# Patient Record
Sex: Male | Born: 1970 | Race: Black or African American | Hispanic: No | Marital: Married | Smoking: Current every day smoker
Health system: Southern US, Community
[De-identification: ages and names within clinical notes are randomized; demographics above are authoritative.]

## PROBLEM LIST (undated history)

## (undated) DIAGNOSIS — Z720 Tobacco use: Secondary | ICD-10-CM

## (undated) DIAGNOSIS — F1010 Alcohol abuse, uncomplicated: Secondary | ICD-10-CM

## (undated) DIAGNOSIS — I10 Essential (primary) hypertension: Secondary | ICD-10-CM

---

## 2007-02-28 ENCOUNTER — Other Ambulatory Visit: Payer: Self-pay

## 2007-02-28 ENCOUNTER — Emergency Department: Payer: Self-pay | Admitting: Emergency Medicine

## 2008-03-08 ENCOUNTER — Emergency Department: Payer: Self-pay | Admitting: Emergency Medicine

## 2008-10-25 ENCOUNTER — Emergency Department: Payer: Self-pay | Admitting: Emergency Medicine

## 2009-07-19 ENCOUNTER — Emergency Department: Payer: Self-pay | Admitting: Emergency Medicine

## 2012-11-01 ENCOUNTER — Emergency Department: Payer: Self-pay | Admitting: Emergency Medicine

## 2013-04-07 ENCOUNTER — Emergency Department: Payer: Self-pay | Admitting: Emergency Medicine

## 2013-10-25 ENCOUNTER — Emergency Department: Payer: Self-pay | Admitting: Internal Medicine

## 2013-10-25 LAB — CBC WITH DIFFERENTIAL/PLATELET
BASOS ABS: 0.1 10*3/uL (ref 0.0–0.1)
BASOS PCT: 0.6 %
EOS ABS: 0 10*3/uL (ref 0.0–0.7)
Eosinophil %: 0.6 %
HCT: 40.9 % (ref 40.0–52.0)
HGB: 13.7 g/dL (ref 13.0–18.0)
Lymphocyte #: 1.9 10*3/uL (ref 1.0–3.6)
Lymphocyte %: 22.5 %
MCH: 30.1 pg (ref 26.0–34.0)
MCHC: 33.5 g/dL (ref 32.0–36.0)
MCV: 90 fL (ref 80–100)
Monocyte #: 0.9 x10 3/mm (ref 0.2–1.0)
Monocyte %: 10.1 %
Neutrophil #: 5.6 10*3/uL (ref 1.4–6.5)
Neutrophil %: 66.2 %
Platelet: 224 10*3/uL (ref 150–440)
RBC: 4.56 10*6/uL (ref 4.40–5.90)
RDW: 13.3 % (ref 11.5–14.5)
WBC: 8.5 10*3/uL (ref 3.8–10.6)

## 2013-10-25 LAB — COMPREHENSIVE METABOLIC PANEL
ANION GAP: 2 — AB (ref 7–16)
Albumin: 3.7 g/dL (ref 3.4–5.0)
Alkaline Phosphatase: 77 U/L
BUN: 7 mg/dL (ref 7–18)
Bilirubin,Total: 0.4 mg/dL (ref 0.2–1.0)
Calcium, Total: 8.9 mg/dL (ref 8.5–10.1)
Chloride: 107 mmol/L (ref 98–107)
Co2: 30 mmol/L (ref 21–32)
Creatinine: 0.9 mg/dL (ref 0.60–1.30)
EGFR (African American): >60
EGFR (Non-African Amer.): >60
GLUCOSE: 73 mg/dL (ref 65–99)
Osmolality: 274 (ref 275–301)
POTASSIUM: 4.1 mmol/L (ref 3.5–5.1)
SGOT(AST): 23 U/L (ref 15–37)
SGPT (ALT): 18 U/L (ref 12–78)
SODIUM: 139 mmol/L (ref 136–145)
TOTAL PROTEIN: 7.9 g/dL (ref 6.4–8.2)

## 2015-01-03 ENCOUNTER — Emergency Department: Payer: Self-pay

## 2015-01-03 ENCOUNTER — Encounter: Payer: Self-pay | Admitting: Emergency Medicine

## 2015-01-03 ENCOUNTER — Emergency Department
Admission: EM | Admit: 2015-01-03 | Discharge: 2015-01-03 | Disposition: A | Discharge status: Home / Self Care | Payer: Self-pay | Attending: Student | Admitting: Student

## 2015-01-03 DIAGNOSIS — Y9389 Activity, other specified: Secondary | ICD-10-CM | POA: Insufficient documentation

## 2015-01-03 DIAGNOSIS — Z720 Tobacco use: Secondary | ICD-10-CM | POA: Insufficient documentation

## 2015-01-03 DIAGNOSIS — F10920 Alcohol use, unspecified with intoxication, uncomplicated: Secondary | ICD-10-CM

## 2015-01-03 DIAGNOSIS — Y998 Other external cause status: Secondary | ICD-10-CM | POA: Insufficient documentation

## 2015-01-03 DIAGNOSIS — S00211A Abrasion of right eyelid and periocular area, initial encounter: Secondary | ICD-10-CM | POA: Insufficient documentation

## 2015-01-03 DIAGNOSIS — F10129 Alcohol abuse with intoxication, unspecified: Secondary | ICD-10-CM | POA: Insufficient documentation

## 2015-01-03 DIAGNOSIS — S0990XA Unspecified injury of head, initial encounter: Secondary | ICD-10-CM | POA: Insufficient documentation

## 2015-01-03 DIAGNOSIS — Y9289 Other specified places as the place of occurrence of the external cause: Secondary | ICD-10-CM | POA: Insufficient documentation

## 2015-01-03 DIAGNOSIS — I10 Essential (primary) hypertension: Secondary | ICD-10-CM | POA: Insufficient documentation

## 2015-01-03 DIAGNOSIS — Z23 Encounter for immunization: Secondary | ICD-10-CM | POA: Insufficient documentation

## 2015-01-03 HISTORY — DX: Essential (primary) hypertension: I10

## 2015-01-03 LAB — ETHANOL: ALCOHOL ETHYL (B): 188 mg/dL — AB (ref <5)

## 2015-01-03 MED ORDER — TETANUS-DIPHTH-ACELL PERTUSSIS 5-2.5-18.5 LF-MCG/0.5 IM SUSP
INTRAMUSCULAR | Status: AC
  Administered 2015-01-03: 0.5 mL via INTRAMUSCULAR
  Filled 2015-01-03: qty 0.5

## 2015-01-03 MED ORDER — TETANUS-DIPHTH-ACELL PERTUSSIS 5-2.5-18.5 LF-MCG/0.5 IM SUSP
0.5000 mL | Freq: Once | INTRAMUSCULAR | Status: AC
  Administered 2015-01-03: 0.5 mL via INTRAMUSCULAR

## 2015-01-03 NOTE — ED Notes (Signed)
Pt informed to return if life threatening symptoms occur.   

## 2015-01-03 NOTE — Discharge Instructions (Signed)
Alcohol Intoxication °Alcohol intoxication occurs when you drink enough alcohol that it affects your ability to function. It can be mild or very severe. Drinking a lot of alcohol in a short time is called binge drinking. This can be very harmful. Drinking alcohol can also be more dangerous if you are taking medicines or other drugs. Some of the effects caused by alcohol may include: °· Loss of coordination. °· Changes in mood and behavior. °· Unclear thinking. °· Trouble talking (slurred speech). °· Throwing up (vomiting). °· Confusion. °· Slowed breathing. °· Twitching and shaking (seizures). °· Loss of consciousness. °HOME CARE °· Do not drive after drinking alcohol. °· Drink enough water and fluids to keep your pee (urine) clear or pale yellow. Avoid caffeine. °· Only take medicine as told by your doctor. °GET HELP IF: °· You throw up (vomit) many times. °· You do not feel better after a few days. °· You frequently have alcohol intoxication. Your doctor can help decide if you should see a substance use treatment counselor. °GET HELP RIGHT AWAY IF: °· You become shaky when you stop drinking. °· You have twitching and shaking. °· You throw up blood. It may look bright red or like coffee grounds. °· You notice blood in your poop (bowel movements). °· You become lightheaded or pass out (faint). °MAKE SURE YOU:  °· Understand these instructions. °· Will watch your condition. °· Will get help right away if you are not doing well or get worse. °Document Released: 01/24/2008 Document Revised: 04/09/2013 Document Reviewed: 01/10/2013 °[employer]® Patient Information ©2015 [employer]. This information is not intended to replace advice given to you by your health care provider. Make sure you discuss any questions you have with your health care provider. ° °

## 2015-01-03 NOTE — ED Notes (Signed)
Pt presents to ER alert and in NAD. Pt is in custody, was fighting with police and his head struck the ground, denies LOC. Abrasion and swelling noted to right eye.

## 2015-01-03 NOTE — ED Notes (Signed)
Pt in handcuffs per [employer]. CSM intact.

## 2015-01-03 NOTE — ED Provider Notes (Signed)
[facility] Emergency Department Provider Note  ____________________________________________  Time seen: Approximately 2:32 AM  I have reviewed the triage vital signs and the nursing notes.   HISTORY  Chief Complaint Head Injury  Limited by intoxication.  HPI Alexander Lindsey is a 44 y.o. male with hypertension who presents for evaluation of head injury. The patient is in police custody. Per police, he was resisting arrest  fighting the police and ended up hitting head on the ground. No LOC. He has been drinking alcohol heavily tonight.    Past Medical History  Diagnosis Date  . Hypertension     There are no active problems to display for this patient.   History reviewed. No pertinent past surgical history.  No current outpatient prescriptions on file.  Allergies Review of patient's allergies indicates no known allergies.  History reviewed. No pertinent family history.  Social History History  Substance Use Topics  . Smoking status: Current Every Day Smoker  . Smokeless tobacco: Not on file  . Alcohol Use: Yes    Review of Systems Unable to obtain review of systems secondary to alcohol intoxication.  ____________________________________________   PHYSICAL EXAM:  VITAL SIGNS: ED Triage Vitals  Enc Vitals Group     BP 01/03/15 0016 121/79 mmHg     Pulse Rate 01/03/15 0016 113     Resp 01/03/15 0016 22     Temp 01/03/15 0016 98.1 F (36.7 C)     Temp Source 01/03/15 0016 Oral     SpO2 01/03/15 0016 95 %     Weight 01/03/15 0016 170 lb (77.111 kg)     Height 01/03/15 0016 6\' 3"  (1.905 m)     Head Cir --      Peak Flow --      Pain Score 01/03/15 0147 5     Pain Loc --      Pain Edu? --      Excl. in GC? --     Constitutional: Sleeping but arouses to sternal rub, follows commands, speaks briefly and then returns to sleep, smells of alcohol, slurring words Eyes: Conjunctivae are normal. PERRL. EOMI. Head: Abrasion with mild  swelling superior and lateral to the right eye Nose: No congestion/rhinnorhea. Mouth/Throat: Mucous membranes are moist.  Oropharynx non-erythematous. Neck: No stridor.  Hematological/Lymphatic/Immunilogical: No cervical lymphadenopathy. Cardiovascular: mildly tachycardic rate, regular rhythm. Grossly normal heart sounds.  Good peripheral circulation. Respiratory: Normal respiratory effort.  No retractions. Lungs CTAB. Gastrointestinal: Soft and nontender. No distention. No abdominal bruits. No CVA tenderness. Genitourinary: deferred Musculoskeletal: No lower extremity tenderness nor edema.  No joint effusions. Neurologic:  Slurred speech, answers simple questions, follows commands to move all extremities equally, face symmetric Skin:  Skin is warm, dry and intact. No rash noted. Psychiatric: Mood and affect are preprinted given degree of intoxication.  ____________________________________________   LABS (all labs ordered are listed, but only abnormal results are displayed)  Labs Reviewed  ETHANOL - Abnormal; Notable for the following:    Alcohol, Ethyl (B) 188 (*)    All other components within normal limits   ____________________________________________  EKG  none ____________________________________________  RADIOLOGY  IMPRESSION: CT HEAD: No acute intracranial process.  Mild parenchymal brain volume loss, advanced for age.  CT MAXILLOFACIAL: No acute facial fracture.  Poor dentition.  CT CERVICAL SPINE: Broad reversed cervical lordosis without acute fracture nor malalignment. ____________________________________________   PROCEDURES  Procedure(s) performed: None  Critical Care performed: No  ____________________________________________   INITIAL IMPRESSION / ASSESSMENT  AND PLAN / ED COURSE  Pertinent labs & imaging results that were available during my care of the patient were reviewed by me and considered in my medical decision making (see chart for  details).  Alexander Lindsey is a 44 y.o. male with hypertension who presents for evaluation of head injury. The only notable injury is a small area of swelling with an abrasion to the right of the right eye which has some associated dried blood. He is intoxicated but appears to move all his extremities equally. Plan for CT head, C-spine, maxillofacial. We'll obtain ethanol level and reassess when sober.  ----------------------------------------- 6:54 AM on 01/03/2015 -----------------------------------------  At this time the patient easily awakens from sleep wto voice and light touch. He moves all extremities \\equally  and he is alert and oriented 4. He recalls what happened this evening. He is appropriate. Ethanol level on arrival was 188 and he currently appears sober. Imaging negative for any acute traumatic pathology other than small right frontal scalp hematoma. Tdap given. Discharge with return precautions and PCP follow-up. ____________________________________________   FINAL CLINICAL IMPRESSION(S) / ED DIAGNOSES  Final diagnoses:  Alcohol intoxication, uncomplicated  Head injury, initial encounter      Gayla DossEryka A Martyn Timme, MD 01/03/15 [phone]

## 2015-01-03 NOTE — ED Notes (Signed)
Pt laying in bed with officer at bedside without handcuffs.

## 2015-01-03 NOTE — ED Notes (Signed)
Per charge nurse, results given for rapid test via phone with nursing supervisor.

## 2015-03-25 ENCOUNTER — Emergency Department
Admission: EM | Admit: 2015-03-25 | Discharge: 2015-03-25 | Disposition: A | Discharge status: Home / Self Care | Payer: Self-pay | Attending: Emergency Medicine | Admitting: Emergency Medicine

## 2015-03-25 ENCOUNTER — Emergency Department: Payer: Self-pay

## 2015-03-25 ENCOUNTER — Encounter: Payer: Self-pay | Admitting: Emergency Medicine

## 2015-03-25 DIAGNOSIS — Z720 Tobacco use: Secondary | ICD-10-CM | POA: Insufficient documentation

## 2015-03-25 DIAGNOSIS — R590 Localized enlarged lymph nodes: Secondary | ICD-10-CM | POA: Insufficient documentation

## 2015-03-25 DIAGNOSIS — K055 Other periodontal diseases: Secondary | ICD-10-CM | POA: Insufficient documentation

## 2015-03-25 DIAGNOSIS — J029 Acute pharyngitis, unspecified: Secondary | ICD-10-CM | POA: Insufficient documentation

## 2015-03-25 DIAGNOSIS — A691 Other Vincent's infections: Secondary | ICD-10-CM

## 2015-03-25 DIAGNOSIS — I10 Essential (primary) hypertension: Secondary | ICD-10-CM | POA: Insufficient documentation

## 2015-03-25 MED ORDER — LIDOCAINE VISCOUS 2 % MT SOLN: 20.0000 mL | OROMUCOSAL | Status: DC | PRN

## 2015-03-25 MED ORDER — DEXAMETHASONE SODIUM PHOSPHATE 10 MG/ML IJ SOLN
10.0000 mg | Freq: Once | INTRAMUSCULAR | Status: AC
  Administered 2015-03-25: 10 mg via INTRAMUSCULAR

## 2015-03-25 MED ORDER — DEXAMETHASONE SODIUM PHOSPHATE 10 MG/ML IJ SOLN
INTRAMUSCULAR | Status: AC
  Filled 2015-03-25: qty 1

## 2015-03-25 MED ORDER — KETOROLAC TROMETHAMINE 60 MG/2ML IM SOLN
INTRAMUSCULAR | Status: AC
  Filled 2015-03-25: qty 2

## 2015-03-25 MED ORDER — LIDOCAINE VISCOUS 2 % MT SOLN
OROMUCOSAL | Status: AC
  Filled 2015-03-25: qty 15

## 2015-03-25 MED ORDER — PENICILLIN V POTASSIUM 250 MG/5ML PO SOLR: 500.0000 mg | Freq: Four times a day (QID) | ORAL | Status: DC

## 2015-03-25 MED ORDER — METRONIDAZOLE 500 MG PO TABS: 500.0000 mg | ORAL_TABLET | Freq: Three times a day (TID) | ORAL | Status: DC

## 2015-03-25 MED ORDER — KETOROLAC TROMETHAMINE 60 MG/2ML IM SOLN
60.0000 mg | Freq: Once | INTRAMUSCULAR | Status: AC
  Administered 2015-03-25: 60 mg via INTRAMUSCULAR

## 2015-03-25 MED ORDER — LIDOCAINE VISCOUS 2 % MT SOLN
15.0000 mL | Freq: Once | OROMUCOSAL | Status: AC
  Administered 2015-03-25: 15 mL via OROMUCOSAL

## 2015-03-25 MED ORDER — CHLORHEXIDINE GLUCONATE 0.12 % MT SOLN: 15.0000 mL | Freq: Two times a day (BID) | OROMUCOSAL | Status: DC

## 2015-03-25 NOTE — ED Notes (Signed)
Pt states his throat has been hurting for 2 days now, feels the back of his tongue is swelling now, pt appears in no distress.

## 2015-03-25 NOTE — ED Provider Notes (Signed)
[facility] Emergency Department Provider Note  ____________________________________________  Time seen: 5:10 PM  I have reviewed the triage vital signs and the nursing notes.   HISTORY  Chief Complaint Oral Swelling and Sore Throat    HPI Alexander Lindsey is a 44 y.o. male who complains of 2 days of throat pain. He also has pain in the sides of his neck, and he feels like there is some swelling in the back of his tongue or his tongue is pressing against his throat. It hurts to swallow. No pain in the teeth or dental injuries. No fever or chills or vomiting. No abdominal pain chest pain or shortness of breath. Does not take lisinopril    Past Medical History  Diagnosis Date  . Hypertension     There are no active problems to display for this patient.   History reviewed. No pertinent past surgical history.  Current Outpatient Rx  Name  Route  Sig  Dispense  Refill  . chlorhexidine (PERIDEX) 0.12 % solution   Mouth/Throat   Use as directed 15 mLs in the mouth or throat 2 (two) times daily.   120 mL   0   . lidocaine (XYLOCAINE) 2 % solution   Mouth/Throat   Use as directed 20 mLs in the mouth or throat every 2 (two) hours as needed for mouth pain. Gargle and spit out   100 mL   0   . metroNIDAZOLE (FLAGYL) 500 MG tablet   Oral   Take 1 tablet (500 mg total) by mouth 3 (three) times daily.   30 tablet   0   . penicillin v potassium (VEETID) 250 MG/5ML solution   Oral   Take 10 mLs (500 mg total) by mouth 4 (four) times daily.   300 mL   0     Allergies Review of patient's allergies indicates no known allergies.  No family history on file.  Social History History  Substance Use Topics  . Smoking status: Current Every Day Smoker -- 0.50 packs/day    Types: Cigarettes  . Smokeless tobacco: Not on file  . Alcohol Use: Yes    Review of Systems  Constitutional: No fever or chills. No weight changes Eyes:No blurry vision or  double vision.  ENT: Positive sore throat. Cardiovascular: No chest pain. Respiratory: No dyspnea or cough. Gastrointestinal: Negative for abdominal pain, vomiting and diarrhea.  No BRBPR or melena. Genitourinary: Negative for dysuria, urinary retention, bloody urine, or difficulty urinating. Musculoskeletal: Negative for back pain. No joint swelling or pain. Skin: Negative for rash. Neurological: Negative for headaches, focal weakness or numbness. Psychiatric:No anxiety or depression.   Endocrine:No hot/cold intolerance, changes in energy, or sleep difficulty.  10-point ROS otherwise negative.  ____________________________________________   PHYSICAL EXAM:  VITAL SIGNS: ED Triage Vitals  Enc Vitals Group     BP 03/25/15 1645 141/77 mmHg     Pulse Rate 03/25/15 1645 85     Resp 03/25/15 1645 18     Temp 03/25/15 1645 98.5 F (36.9 C)     Temp Source 03/25/15 1645 Oral     SpO2 03/25/15 1645 99 %     Weight 03/25/15 1645 173 lb (78.472 kg)     Height 03/25/15 1645  (1.905 m)     Head Cir --      Peak Flow --      Pain Score 03/25/15 1645 8     Pain Loc --      Pain  Edu? --      Excl. in GC? --      Constitutional: Alert and oriented. Well appearing and in no distress. Eyes: No scleral icterus. No conjunctival pallor. PERRL. EOMI ENT   Head: Normocephalic and atraumatic.   Nose: No congestion/rhinnorhea. No septal hematoma   Mouth/Throat: MMM, no pharyngeal erythema. No peritonsillar mass. No uvula shift. Positive trismus. Widespread dental decay with multiple missing and broken teeth. There is diffuse gingival hypertrophy with ulcerations at the gumline with the teeth. No purulent drainage or focal gingival mass or fluctuance. No tender teeth.   Neck: No stridor. No SubQ emphysema. No meningismus. Full range of motion Hematological/Lymphatic/Immunilogical: Positive bilateral cervical lymphadenopathy. Cardiovascular: RRR. Normal and symmetric distal  pulses are present in all extremities. No murmurs, rubs, or gallops. Respiratory: Normal respiratory effort without tachypnea nor retractions. Breath sounds are clear and equal bilaterally. No wheezes/rales/rhonchi. Gastrointestinal: Soft and nontender. No distention. There is no CVA tenderness.  No rebound, rigidity, or guarding. Genitourinary: deferred Musculoskeletal: Nontender with normal range of motion in all extremities. No joint effusions.  No lower extremity tenderness.  No edema. Neurologic:   Normal speech and language.  CN 2-10 normal. Motor grossly intact. No pronator drift.  Normal gait. No gross focal neurologic deficits are appreciated.  Skin:  Skin is warm, dry and intact. No rash noted.  No petechiae, purpura, or bullae. Psychiatric: Mood and affect are normal. Speech and behavior are normal. Patient exhibits appropriate insight and judgment.  ____________________________________________    LABS (pertinent positives/negatives) (all labs ordered are listed, but only abnormal results are displayed) Labs Reviewed - No data to display ____________________________________________   EKG    ____________________________________________    RADIOLOGY  Soft tissue neck x-ray unremarkable  ____________________________________________   PROCEDURES  ____________________________________________   INITIAL IMPRESSION / ASSESSMENT AND PLAN / ED COURSE  Pertinent labs & imaging results that were available during my care of the patient were reviewed by me and considered in my medical decision making (see chart for details).  Patient presents with sore throat and a sensation of tongue swelling although the tongue looks normal. No evidence of angioedema or airway compromise. He is handling secretions easily. The lymphadenopathy may be a reaction to acute necrotizing ulcerative gingivitis which is apparent on exam, but we will also get an x-ray soft tissue neck to evaluate  for retropharyngeal abscess. He has normal vital signs and is in no distress at this time. I'll also give him viscous lidocaine, Toradol, Decadron for symptom relief. ----------------------------------------- 6:50 PM on 03/25/2015 -----------------------------------------  Patient feels better after Decadron and Toradol and lidocaine. X-rays unremarkable. He is still tolerating secretions and in no distress. Calm and comfortable and pleasant. This appears to be due to necrotizing periodontal disease that is clinically apparent on exam, and although his essentially normal vital signs, he is having some regional inflammatory response with lymphadenopathy. We'll start him on penicillin and Flagyl as well as Peridex and have him follow-up with dentistry for further management. ____________________________________________   FINAL CLINICAL IMPRESSION(S) / ED DIAGNOSES  Final diagnoses:  Sore throat  Necrotizing periodontal disease      Sharman Cheek, MD 03/25/15 1851

## 2015-03-25 NOTE — Discharge Instructions (Signed)
Gingivitis Gingivitis is a form of gum (periodontal) disease that causes redness, soreness, and swelling (inflammation) of your gums. CAUSES The most common cause of gingivitis is poor oral hygiene. A sticky substance made of bacteria, mucus, and food particles (plaque), is deposited on the exposed part of teeth. As plaque builds up, it reacts with the saliva in your mouth to form something called  tartar. Tartar is a hard deposit that becomes trapped around the base of the tooth. Plaque and tartar irritate the gums, leading to the formation of gingivitis. Other factors that increase your risk for gingivitis include:   Tobacco use.  Diabetes.  Older age.  Certain medications.  Certain viral or fungal infections.  Dry mouth.  Hormonal changes such as during pregnancy.  Poor nutrition.  Substance abuse.  Poor fitting dental restorations or appliances. SYMPTOMS You may notice inflammation of the soft tissue (gingiva) around the teeth. When these tissues become inflamed, they bleed easily, especially during flossing or brushing. The gums may also be:   Tender to the touch.  Bright red, purple red, or have a shiny appearance.  Swollen.  Wearing away from the teeth (receding), which exposes more of the tooth. Bad breath is often present. Continued infection around teeth can eventually cause cavities and loosen teeth. This may lead to eventual tooth loss. DIAGNOSIS A medical and dental history will be taken. Your mouth, teeth, and gums will be examined. Your dentist will look for soft, swollen purple-red, irritated gums. There may be deposits of plaque and tartar at the base of the teeth. Your gums will be looked at for the degree of redness, puffiness, and bleeding tendencies. Your dentist will see if any of the teeth are loose. X-rays may be taken to see if the inflammation has spread to the supporting structures of the teeth. TREATMENT The goal is to reduce and reverse the  inflammation. Proper treatment can usually reverse the symptoms of gingivitis and prevent further progression of the disease. Have your teeth cleaned. During the cleaning, all plaque and tartar will be removed. Instruction for proper home care will be given. You will need regular professional cleanings and check-ups in the future. HOME CARE INSTRUCTIONS  Brush your teeth twice a day and floss at least once per day. When flossing, it is best to floss first then brush.  Limit sugar between meals and maintain a well-balanced diet.  Even the best dental hygiene will not prevent plaque from developing. It is necessary for you to see your dentist on a regular basis for cleaning and regular checkups.  Your dentist can recommend proper oral hygiene and mouth care and suggest special toothpastes or mouth rinses.  Stop smoking. SEEK DENTAL OR MEDICAL CARE IF:  You have painful, reddened tissue around your teeth, or you have puffy swollen gums.  You have difficulty chewing.  You notice any loose or infected teeth.  You have swollen glands.  Your gums bleed easily when you brush your teeth or are very tender to the touch. Document Released: 01/31/2001 Document Revised: 10/30/2011 Document Reviewed: 11/11/2010 [facility] Patient Information 2015 [place], [place]. This information is not intended to replace advice given to you by your health care provider. Make sure you discuss any questions you have with your health care provider.  Pharyngitis Pharyngitis is redness, pain, and swelling (inflammation) of your pharynx.  CAUSES  Pharyngitis is usually caused by infection. Most of the time, these infections are from viruses (viral) and are part of a cold. However, sometimes  pharyngitis is caused by bacteria (bacterial). Pharyngitis can also be caused by allergies. Viral pharyngitis may be spread from person to person by coughing, sneezing, and personal items or utensils (cups, forks, spoons, toothbrushes).  Bacterial pharyngitis may be spread from person to person by more intimate contact, such as kissing.  SIGNS AND SYMPTOMS  Symptoms of pharyngitis include:   Sore throat.   Tiredness (fatigue).   Low-grade fever.   Headache.  Joint pain and muscle aches.  Skin rashes.  Swollen lymph nodes.  Plaque-like film on throat or tonsils (often seen with bacterial pharyngitis). DIAGNOSIS  Your health care provider will ask you questions about your illness and your symptoms. Your medical history, along with a physical exam, is often all that is needed to diagnose pharyngitis. Sometimes, a rapid strep test is done. Other lab tests may also be done, depending on the suspected cause.  TREATMENT  Viral pharyngitis will usually get better in 3-4 days without the use of medicine. Bacterial pharyngitis is treated with medicines that kill germs (antibiotics).  HOME CARE INSTRUCTIONS   Drink enough water and fluids to keep your urine clear or pale yellow.   Only take over-the-counter or prescription medicines as directed by your health care provider:   If you are prescribed antibiotics, make sure you finish them even if you start to feel better.   Do not take aspirin.   Get lots of rest.   Gargle with 8 oz of salt water ( tsp of salt per 1 qt of water) as often as every 1-2 hours to soothe your throat.   Throat lozenges (if you are not at risk for choking) or sprays may be used to soothe your throat. SEEK MEDICAL CARE IF:   You have large, tender lumps in your neck.  You have a rash.  You cough up green, yellow-brown, or bloody spit. SEEK IMMEDIATE MEDICAL CARE IF:   Your neck becomes stiff.  You drool or are unable to swallow liquids.  You vomit or are unable to keep medicines or liquids down.  You have severe pain that does not go away with the use of recommended medicines.  You have trouble breathing (not caused by a stuffy nose). MAKE SURE YOU:   Understand these  instructions.  Will watch your condition.  Will get help right away if you are not doing well or get worse. Document Released: 08/07/2005 Document Revised: 05/28/2013 Document Reviewed: 04/14/2013 [facility] Patient Information 2015 [place], [place]. This information is not intended to replace advice given to you by your health care provider. Make sure you discuss any questions you have with your health care provider.  Sore Throat A sore throat is pain, burning, irritation, or scratchiness of the throat. There is often pain or tenderness when swallowing or talking. A sore throat may be accompanied by other symptoms, such as coughing, sneezing, fever, and swollen neck glands. A sore throat is often the first sign of another sickness, such as a cold, flu, strep throat, or mononucleosis (commonly known as mono). Most sore throats go away without medical treatment. CAUSES  The most common causes of a sore throat include:  A viral infection, such as a cold, flu, or mono.  A bacterial infection, such as strep throat, tonsillitis, or whooping cough.  Seasonal allergies.  Dryness in the air.  Irritants, such as smoke or pollution.  Gastroesophageal reflux disease (GERD). HOME CARE INSTRUCTIONS   Only take over-the-counter medicines as directed by your caregiver.  Drink enough fluids  to keep your urine clear or pale yellow.  Rest as needed.  Try using throat sprays, lozenges, or sucking on hard candy to ease any pain (if older than 4 years or as directed).  Sip warm liquids, such as broth, herbal tea, or warm water with honey to relieve pain temporarily. You may also eat or drink cold or frozen liquids such as frozen ice pops.  Gargle with salt water (mix 1 tsp salt with 8 oz of water).  Do not smoke and avoid secondhand smoke.  Put a cool-mist humidifier in your bedroom at night to moisten the air. You can also turn on a hot shower and sit in the bathroom with the door closed for 5-10  minutes. SEEK IMMEDIATE MEDICAL CARE IF:  You have difficulty breathing.  You are unable to swallow fluids, soft foods, or your saliva.  You have increased swelling in the throat.  Your sore throat does not get better in 7 days.  You have nausea and vomiting.  You have a fever or persistent symptoms for more than 2-3 days.  You have a fever and your symptoms suddenly get worse. MAKE SURE YOU:   Understand these instructions.  Will watch your condition.  Will get help right away if you are not doing well or get worse. Document Released: 09/14/2004 Document Revised: 07/24/2012 Document Reviewed: 04/14/2012 [facility] Patient Information 2015 [place], [place]. This information is not intended to replace advice given to you by your health care provider. Make sure you discuss any questions you have with your health care provider.

## 2016-06-14 IMAGING — CT CT CERVICAL SPINE W/O CM
4 of 9 series · 11 of 35 positions shown, 12 images · non-contrast
Comparison: None.

CLINICAL DATA: Altercation with police tonight while in custody.
RIGHT eye swelling. Combative. History of hypertension.

EXAM:
CT HEAD WITHOUT CONTRAST
CT MAXILLOFACIAL WITHOUT CONTRAST
CT CERVICAL SPINE WITHOUT CONTRAST
TECHNIQUE: Multidetector CT imaging of the head, cervical spine, and
maxillofacial structures were performed using the standard protocol
without intravenous contrast. Multiplanar CT image reconstructions
of the cervical spine and maxillofacial structures were also
generated.

[Series 9: soft tissue · axial · 0.29mm/px · z∈[-183,-113]mm · 2 of 107 slices shown]
[im 36/107  soft-tissue]
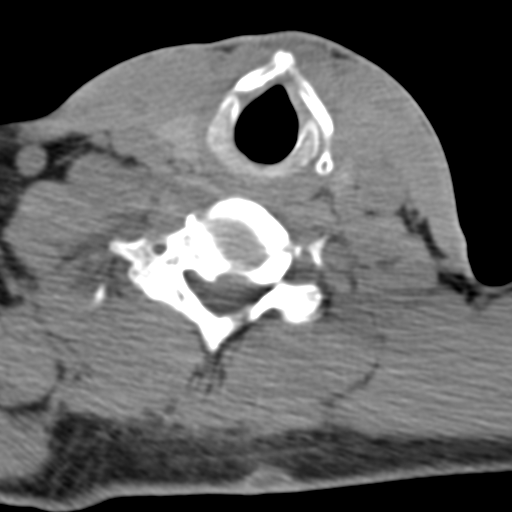
[im 71/107  soft-tissue]
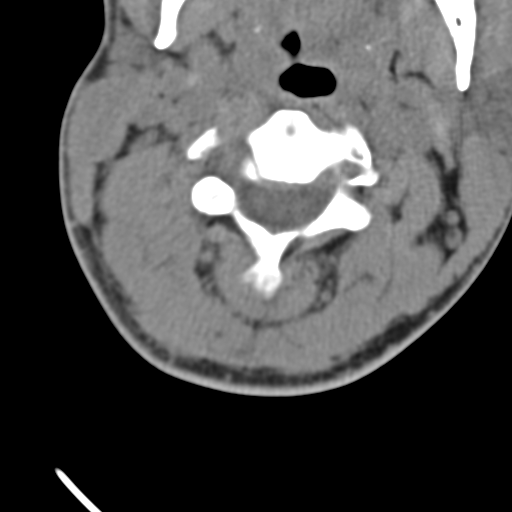

[Series 12: sagittal bone · sagittal · 0.24mm/px · 4 of 49 slices shown]
[im 7/49  bone]
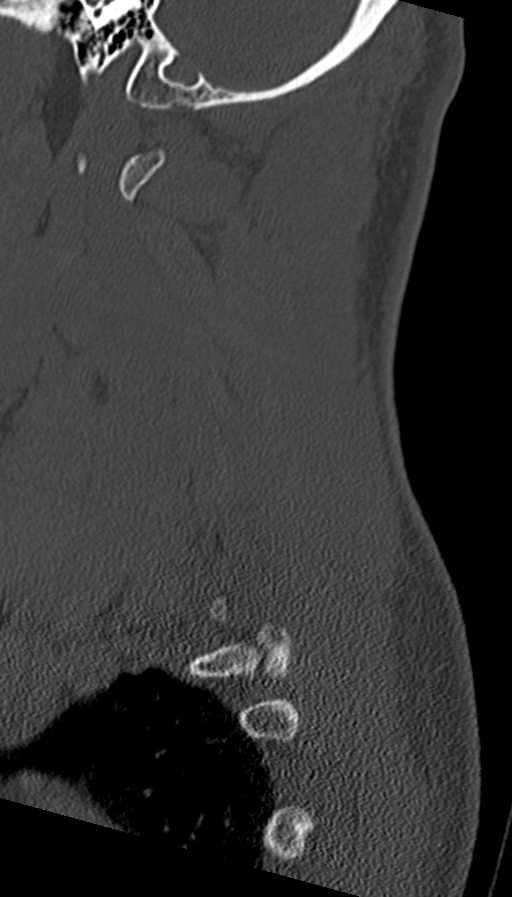
[im 17/49  bone]
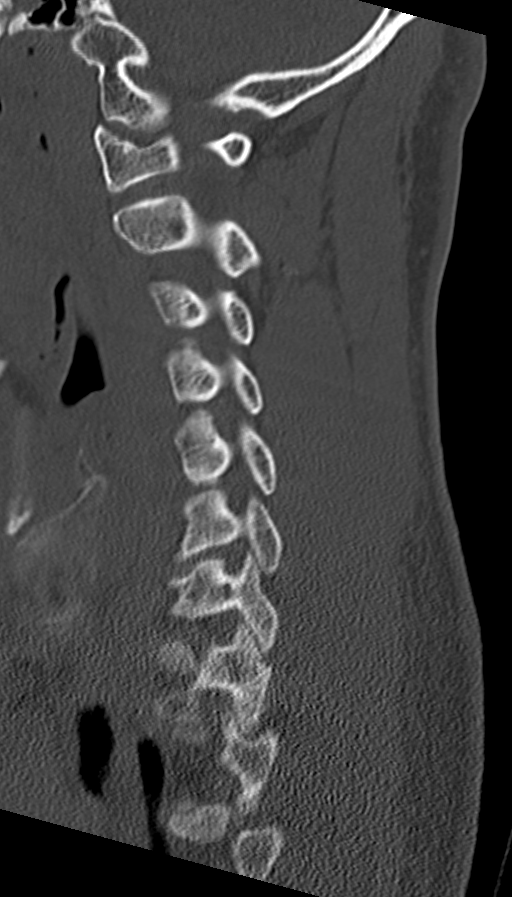
[im 28/49  bone]
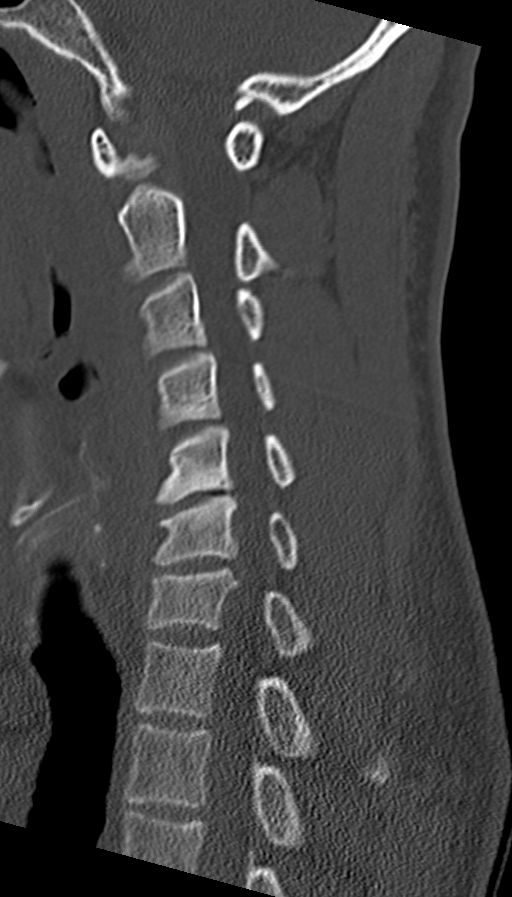
[im 38/49  bone]
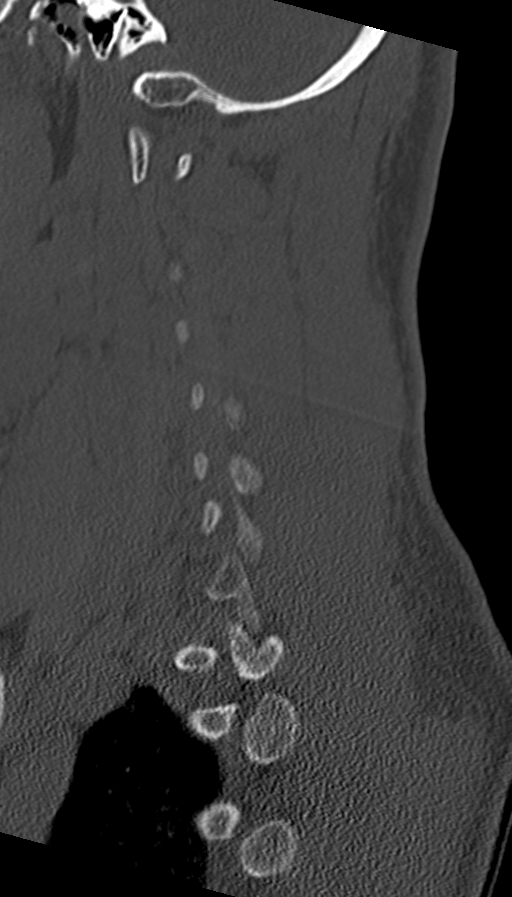

[Series 14: axial · axial · 0.21mm/px · z∈[-193,-125]mm · 2 of 109 slices shown, 3 images]
[im 37/109  soft-tissue]
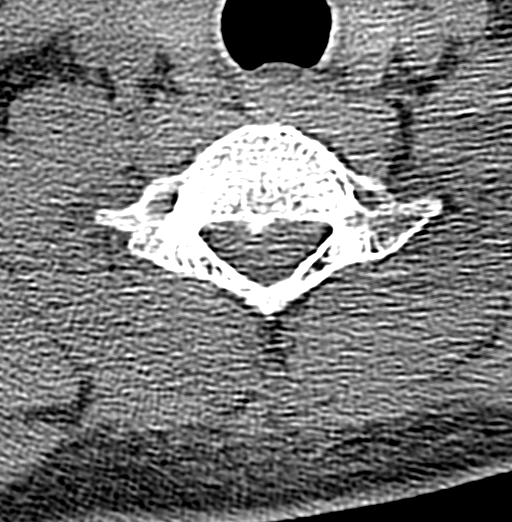
[im 37/109  bone]
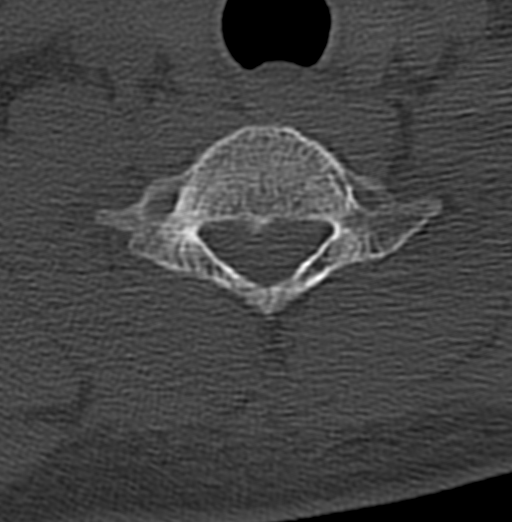
[im 73/109  bone]
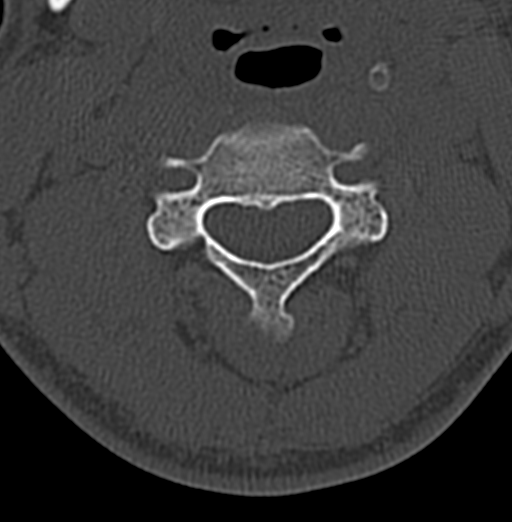

[Series 15: coronal soft · coronal · 0.33mm/px · 3 of 79 slices shown]
[im 17/79  bone]
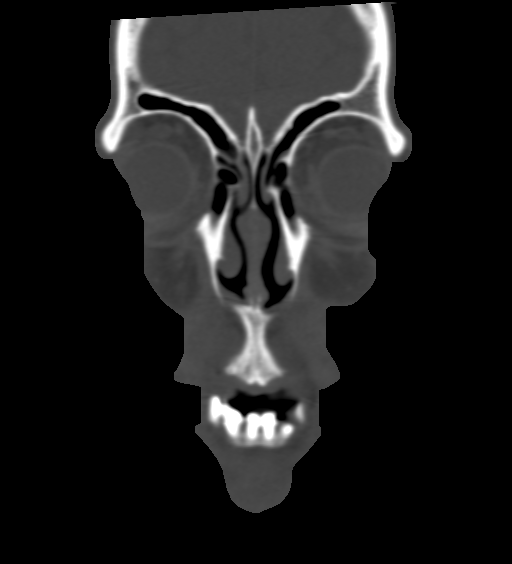
[im 33/79  bone]
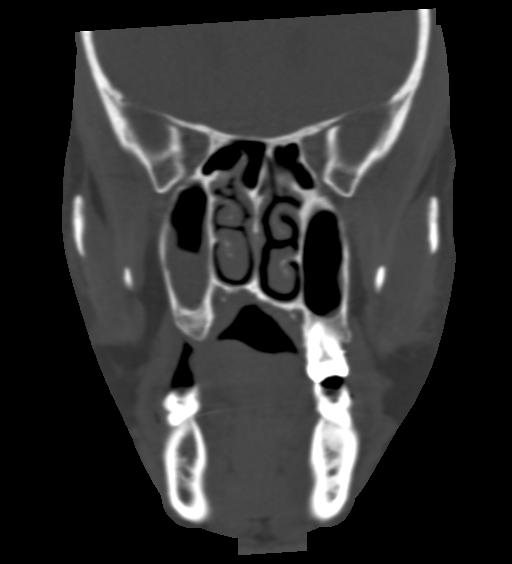
[im 49/79  bone]
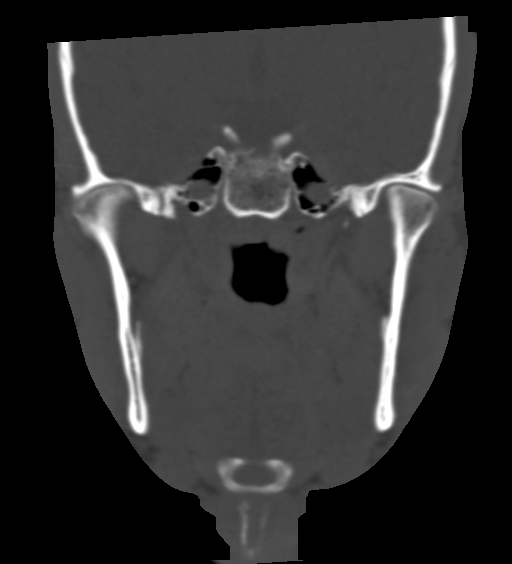

[11 of 35 positions shown; findings below may reference images not displayed]

FINDINGS: CT HEAD FINDINGS

Mild ventriculomegaly, likely on the basis of global parenchymal
brain volume loss as there is overall commensurate enlargement of
cerebral sulci and cerebellar folia, advanced for age. No
intraparenchymal hemorrhage, mass effect nor midline shift. No acute
large vascular territory infarcts.

No abnormal extra-axial fluid collections. Basal cisterns are
patent. No skull fracture.

CT MAXILLOFACIAL FINDINGS

The mandible is intact, condyles are located. Poor dentition with
multiple dental caries and periapical lucency/ abscess. No acute
facial fracture. Mild paranasal sinus mucosal thickening without
air-fluid levels. The mastoid air cells are well aerated.

Ocular globes and orbital contents are unremarkable. Small RIGHT
frontal scalp hematoma.

CT CERVICAL SPINE FINDINGS

Cervical vertebral bodies and posterior elements are intact and
aligned, broad reversed cervical lordosis. Moderate C5-6, mild to
moderate C6-7 disc height loss, endplate sclerosis and marginal
spurring consistent with degenerative disc, resulting in mild RIGHT
C5-6 neural foraminal narrowing. Moderate osseous canal stenosis
C5-6 and C6-7. C1-2 articulation maintained. No destructive bony
lesions. The included prevertebral and paraspinal soft tissues are
nonsuspicious.
IMPRESSION: CT HEAD: No acute intracranial process.

Mild parenchymal brain volume loss, advanced for age.

CT MAXILLOFACIAL: No acute facial fracture.

Poor dentition.

CT CERVICAL SPINE: Broad reversed cervical lordosis without acute
fracture nor malalignment.

By: Pablita Ed

## 2016-07-11 ENCOUNTER — Other Ambulatory Visit
Admission: EM | Admit: 2016-07-11 | Discharge: 2016-07-11 | Disposition: A | Discharge status: Home / Self Care | Attending: Family Medicine | Admitting: Family Medicine

## 2016-07-11 ENCOUNTER — Emergency Department
Admission: EM | Admit: 2016-07-11 | Discharge: 2016-07-11 | Disposition: A | Discharge status: Still In Hospital | Payer: Self-pay

## 2016-07-11 NOTE — ED Notes (Signed)
Patient ambulatory to triage with steady gait, without difficulty or distress noted, in custody of [facility] [employer] officer C.S. Swink for forensic blood draw; pt A&Ox3, with no c/o voiced and denies need to see ED provider; pt voices good understanding of blood draw to be performed for forensic testing; using sealed kit provided by officer, tourniquet applied to right upper arm; right antecubital region prepped with betadine swab and allowed to dry completely; needle inserted and 2 grey top blood tubes collected; tourniquet removed, needle removed & intact, dressing applied; tubes labeled, given to officer and placed in sealed container using chain of custody; pt tolerated well and continues to deny c/o or need to see ED provider; pt d/c in police custody

## 2017-09-26 ENCOUNTER — Emergency Department: Payer: Medicaid Other

## 2017-09-26 ENCOUNTER — Inpatient Hospital Stay
Admission: EM | Admit: 2017-09-26 | Discharge: 2017-09-28 | DRG: 193 | Disposition: A | Discharge status: Home / Self Care | Payer: Medicaid Other | Attending: Internal Medicine | Admitting: Internal Medicine

## 2017-09-26 ENCOUNTER — Inpatient Hospital Stay: Payer: Medicaid Other

## 2017-09-26 ENCOUNTER — Encounter: Payer: Self-pay | Admitting: Intensive Care

## 2017-09-26 DIAGNOSIS — J440 Chronic obstructive pulmonary disease with acute lower respiratory infection: Secondary | ICD-10-CM | POA: Diagnosis present

## 2017-09-26 DIAGNOSIS — J189 Pneumonia, unspecified organism: Secondary | ICD-10-CM

## 2017-09-26 DIAGNOSIS — F1010 Alcohol abuse, uncomplicated: Secondary | ICD-10-CM | POA: Diagnosis present

## 2017-09-26 DIAGNOSIS — I119 Hypertensive heart disease without heart failure: Secondary | ICD-10-CM | POA: Diagnosis present

## 2017-09-26 DIAGNOSIS — J181 Lobar pneumonia, unspecified organism: Secondary | ICD-10-CM | POA: Diagnosis present

## 2017-09-26 DIAGNOSIS — F17210 Nicotine dependence, cigarettes, uncomplicated: Secondary | ICD-10-CM | POA: Diagnosis present

## 2017-09-26 DIAGNOSIS — Z79899 Other long term (current) drug therapy: Secondary | ICD-10-CM

## 2017-09-26 DIAGNOSIS — Z8249 Family history of ischemic heart disease and other diseases of the circulatory system: Secondary | ICD-10-CM | POA: Diagnosis not present

## 2017-09-26 DIAGNOSIS — Z716 Tobacco abuse counseling: Secondary | ICD-10-CM | POA: Diagnosis not present

## 2017-09-26 DIAGNOSIS — J441 Chronic obstructive pulmonary disease with (acute) exacerbation: Secondary | ICD-10-CM | POA: Diagnosis present

## 2017-09-26 DIAGNOSIS — J9601 Acute respiratory failure with hypoxia: Secondary | ICD-10-CM

## 2017-09-26 DIAGNOSIS — R109 Unspecified abdominal pain: Secondary | ICD-10-CM

## 2017-09-26 DIAGNOSIS — J9600 Acute respiratory failure, unspecified whether with hypoxia or hypercapnia: Secondary | ICD-10-CM

## 2017-09-26 DIAGNOSIS — A419 Sepsis, unspecified organism: Secondary | ICD-10-CM

## 2017-09-26 HISTORY — DX: Alcohol abuse, uncomplicated: F10.10

## 2017-09-26 HISTORY — DX: Tobacco use: Z72.0

## 2017-09-26 LAB — BASIC METABOLIC PANEL
ANION GAP: 9 (ref 5–15)
BUN: 12 mg/dL (ref 6–20)
CO2: 24 mmol/L (ref 22–32)
CREATININE: 0.81 mg/dL (ref 0.61–1.24)
Calcium: 9.4 mg/dL (ref 8.9–10.3)
Chloride: 104 mmol/L (ref 101–111)
GFR calc Af Amer: >60 mL/min (ref >60)
GLUCOSE: 119 mg/dL — AB (ref 65–99)
Potassium: 4.3 mmol/L (ref 3.5–5.1)
Sodium: 137 mmol/L (ref 135–145)

## 2017-09-26 LAB — BLOOD GAS, VENOUS
ACID-BASE DEFICIT: 1.3 mmol/L (ref 0.0–2.0)
Bicarbonate: 23.7 mmol/L (ref 20.0–28.0)
Delivery systems: POSITIVE
FIO2: 0.3
Mechanical Rate: 8
O2 Saturation: 94.8 %
PATIENT TEMPERATURE: 37
PCO2 VEN: 40 mmHg — AB (ref 44.0–60.0)
PEEP: 5 cmH2O
PH VEN: 7.38 (ref 7.250–7.430)
PO2 VEN: 76 mmHg — AB (ref 32.0–45.0)
Pressure support: 10 cmH2O

## 2017-09-26 LAB — GLUCOSE, CAPILLARY: Glucose-Capillary: 144 mg/dL — ABNORMAL HIGH (ref 65–99)

## 2017-09-26 LAB — TROPONIN I

## 2017-09-26 LAB — CBC
HCT: 45.7 % (ref 40.0–52.0)
Hemoglobin: 14.8 g/dL (ref 13.0–18.0)
MCH: 28.5 pg (ref 26.0–34.0)
MCHC: 32.4 g/dL (ref 32.0–36.0)
MCV: 87.9 fL (ref 80.0–100.0)
Platelets: 314 10*3/uL (ref 150–440)
RBC: 5.19 MIL/uL (ref 4.40–5.90)
RDW: 13.4 % (ref 11.5–14.5)
WBC: 15.7 10*3/uL — ABNORMAL HIGH (ref 3.8–10.6)

## 2017-09-26 LAB — HEPATIC FUNCTION PANEL
ALBUMIN: 3.7 g/dL (ref 3.5–5.0)
ALK PHOS: 57 U/L (ref 38–126)
ALT: 22 U/L (ref 17–63)
AST: 17 U/L (ref 15–41)
BILIRUBIN TOTAL: 0.4 mg/dL (ref 0.3–1.2)
Total Protein: 7.3 g/dL (ref 6.5–8.1)

## 2017-09-26 LAB — PROCALCITONIN: Procalcitonin: 1.36 ng/mL

## 2017-09-26 LAB — LACTIC ACID, PLASMA
LACTIC ACID, VENOUS: 1 mmol/L (ref 0.5–1.9)
Lactic Acid, Venous: 0.8 mmol/L (ref 0.5–1.9)

## 2017-09-26 LAB — LIPASE, BLOOD: LIPASE: 22 U/L (ref 11–51)

## 2017-09-26 LAB — BRAIN NATRIURETIC PEPTIDE: B Natriuretic Peptide: 27 pg/mL (ref 0.0–100.0)

## 2017-09-26 LAB — MRSA PCR SCREENING: MRSA BY PCR: NEGATIVE

## 2017-09-26 MED ORDER — IBUPROFEN 400 MG PO TABS
ORAL_TABLET | ORAL | Status: AC
  Filled 2017-09-26: qty 1

## 2017-09-26 MED ORDER — DEXTROSE 5 % IV SOLN
2.0000 g | Freq: Once | INTRAVENOUS | Status: DC
  Filled 2017-09-26: qty 2

## 2017-09-26 MED ORDER — OXYCODONE HCL 5 MG PO TABS
5.0000 mg | ORAL_TABLET | ORAL | Status: DC | PRN
  Administered 2017-09-26 – 2017-09-27 (×3): 5 mg via ORAL
  Filled 2017-09-26 (×3): qty 1

## 2017-09-26 MED ORDER — DEXTROSE 5 % IV SOLN
500.0000 mg | INTRAVENOUS | Status: DC
  Administered 2017-09-26: 500 mg via INTRAVENOUS
  Filled 2017-09-26: qty 500

## 2017-09-26 MED ORDER — HYDRALAZINE HCL 20 MG/ML IJ SOLN: 10.0000 mg | Freq: Four times a day (QID) | INTRAMUSCULAR | Status: DC | PRN

## 2017-09-26 MED ORDER — ONDANSETRON HCL 4 MG PO TABS: 4.0000 mg | ORAL_TABLET | Freq: Four times a day (QID) | ORAL | Status: DC | PRN

## 2017-09-26 MED ORDER — ADULT MULTIVITAMIN W/MINERALS CH
1.0000 | ORAL_TABLET | Freq: Every day | ORAL | Status: DC
  Administered 2017-09-28: 1 via ORAL
  Filled 2017-09-26: qty 1

## 2017-09-26 MED ORDER — LORAZEPAM 1 MG PO TABS
1.0000 mg | ORAL_TABLET | Freq: Four times a day (QID) | ORAL | Status: DC | PRN
  Administered 2017-09-27 – 2017-09-28 (×3): 1 mg via ORAL
  Filled 2017-09-26 (×3): qty 1

## 2017-09-26 MED ORDER — PROMETHAZINE HCL 25 MG/ML IJ SOLN
12.5000 mg | Freq: Four times a day (QID) | INTRAMUSCULAR | Status: DC | PRN
  Administered 2017-09-26: 12.5 mg via INTRAVENOUS
  Filled 2017-09-26: qty 1

## 2017-09-26 MED ORDER — SODIUM CHLORIDE 0.9 % IV BOLUS (SEPSIS)
1000.0000 mL | Freq: Once | INTRAVENOUS | Status: AC
Start: 2017-09-26 — End: 2017-09-26
  Administered 2017-09-26: 1000 mL via INTRAVENOUS

## 2017-09-26 MED ORDER — FOLIC ACID 1 MG PO TABS
1.0000 mg | ORAL_TABLET | Freq: Every day | ORAL | Status: DC
  Administered 2017-09-28: 1 mg via ORAL
  Filled 2017-09-26: qty 1

## 2017-09-26 MED ORDER — VITAMIN B-1 100 MG PO TABS
100.0000 mg | ORAL_TABLET | Freq: Every day | ORAL | Status: DC
  Administered 2017-09-28: 100 mg via ORAL
  Filled 2017-09-26: qty 1

## 2017-09-26 MED ORDER — METHYLPREDNISOLONE SODIUM SUCC 125 MG IJ SOLR
60.0000 mg | INTRAMUSCULAR | Status: DC
  Administered 2017-09-26 – 2017-09-27 (×2): 60 mg via INTRAVENOUS
  Filled 2017-09-26 (×2): qty 2

## 2017-09-26 MED ORDER — DEXTROSE 5 % IV SOLN
1.0000 g | INTRAVENOUS | Status: DC
  Administered 2017-09-26: 1 g via INTRAVENOUS
  Filled 2017-09-26: qty 10

## 2017-09-26 MED ORDER — SODIUM CHLORIDE 0.9 % IV BOLUS (SEPSIS)
1000.0000 mL | Freq: Once | INTRAVENOUS | Status: AC
  Administered 2017-09-26: 1000 mL via INTRAVENOUS

## 2017-09-26 MED ORDER — PIPERACILLIN-TAZOBACTAM 3.375 G IVPB
3.3750 g | Freq: Three times a day (TID) | INTRAVENOUS | Status: DC
  Administered 2017-09-26 – 2017-09-28 (×5): 3.375 g via INTRAVENOUS
  Filled 2017-09-26 (×5): qty 50

## 2017-09-26 MED ORDER — FENTANYL CITRATE (PF) 100 MCG/2ML IJ SOLN
12.5000 ug | INTRAMUSCULAR | Status: DC | PRN
  Administered 2017-09-27: 12.5 ug via INTRAVENOUS
  Filled 2017-09-26: qty 2

## 2017-09-26 MED ORDER — SODIUM CHLORIDE 0.9% FLUSH
3.0000 mL | Freq: Two times a day (BID) | INTRAVENOUS | Status: DC
  Administered 2017-09-26 – 2017-09-28 (×4): 3 mL via INTRAVENOUS

## 2017-09-26 MED ORDER — ACETAMINOPHEN 325 MG PO TABS: 650.0000 mg | ORAL_TABLET | Freq: Four times a day (QID) | ORAL | Status: DC | PRN

## 2017-09-26 MED ORDER — VANCOMYCIN HCL IN DEXTROSE 1-5 GM/200ML-% IV SOLN: 1000.0000 mg | Freq: Once | INTRAVENOUS | Status: DC

## 2017-09-26 MED ORDER — ALBUTEROL SULFATE (2.5 MG/3ML) 0.083% IN NEBU: 2.5000 mg | INHALATION_SOLUTION | RESPIRATORY_TRACT | Status: DC | PRN

## 2017-09-26 MED ORDER — LORAZEPAM 2 MG/ML IJ SOLN: 1.0000 mg | Freq: Four times a day (QID) | INTRAMUSCULAR | Status: DC | PRN

## 2017-09-26 MED ORDER — ENOXAPARIN SODIUM 40 MG/0.4ML ~~LOC~~ SOLN
40.0000 mg | SUBCUTANEOUS | Status: DC
  Administered 2017-09-26 – 2017-09-27 (×2): 40 mg via SUBCUTANEOUS
  Filled 2017-09-26 (×2): qty 0.4

## 2017-09-26 MED ORDER — ONDANSETRON HCL 4 MG/2ML IJ SOLN: 4.0000 mg | Freq: Four times a day (QID) | INTRAMUSCULAR | Status: DC | PRN

## 2017-09-26 MED ORDER — DEXTROSE 5 % IV SOLN
2.0000 g | Freq: Three times a day (TID) | INTRAVENOUS | Status: DC
  Filled 2017-09-26 (×2): qty 2

## 2017-09-26 MED ORDER — IBUPROFEN 400 MG PO TABS
400.0000 mg | ORAL_TABLET | Freq: Once | ORAL | Status: AC | PRN
  Administered 2017-09-26: 400 mg via ORAL

## 2017-09-26 MED ORDER — ACETAMINOPHEN 650 MG RE SUPP: 650.0000 mg | Freq: Four times a day (QID) | RECTAL | Status: DC | PRN

## 2017-09-26 MED ORDER — KETOROLAC TROMETHAMINE 30 MG/ML IJ SOLN
30.0000 mg | Freq: Four times a day (QID) | INTRAMUSCULAR | Status: DC | PRN
  Administered 2017-09-26 – 2017-09-28 (×5): 30 mg via INTRAVENOUS
  Filled 2017-09-26 (×5): qty 1

## 2017-09-26 MED ORDER — IOPAMIDOL (ISOVUE-370) INJECTION 76%
75.0000 mL | Freq: Once | INTRAVENOUS | Status: AC | PRN
  Administered 2017-09-26: 75 mL via INTRAVENOUS

## 2017-09-26 MED ORDER — FENTANYL CITRATE (PF) 100 MCG/2ML IJ SOLN
12.5000 ug | Freq: Once | INTRAMUSCULAR | Status: AC
  Administered 2017-09-26: 12.5 ug via INTRAVENOUS
  Filled 2017-09-26: qty 2

## 2017-09-26 MED ORDER — IPRATROPIUM-ALBUTEROL 0.5-2.5 (3) MG/3ML IN SOLN
3.0000 mL | Freq: Once | RESPIRATORY_TRACT | Status: AC
  Administered 2017-09-26: 3 mL via RESPIRATORY_TRACT
  Filled 2017-09-26: qty 3

## 2017-09-26 MED ORDER — MORPHINE SULFATE (PF) 4 MG/ML IV SOLN
4.0000 mg | INTRAVENOUS | Status: DC | PRN
Start: 2017-09-26 — End: 2017-09-26
  Administered 2017-09-26: 4 mg via INTRAVENOUS
  Filled 2017-09-26: qty 1

## 2017-09-26 MED ORDER — ENOXAPARIN SODIUM 40 MG/0.4ML ~~LOC~~ SOLN
SUBCUTANEOUS | Status: AC
  Filled 2017-09-26: qty 0.4

## 2017-09-26 MED ORDER — IPRATROPIUM-ALBUTEROL 0.5-2.5 (3) MG/3ML IN SOLN
3.0000 mL | Freq: Four times a day (QID) | RESPIRATORY_TRACT | Status: DC
  Administered 2017-09-26 – 2017-09-28 (×8): 3 mL via RESPIRATORY_TRACT
  Filled 2017-09-26 (×8): qty 3

## 2017-09-26 MED ORDER — POLYETHYLENE GLYCOL 3350 17 G PO PACK: 17.0000 g | PACK | Freq: Every day | ORAL | Status: DC | PRN

## 2017-09-26 MED ORDER — THIAMINE HCL 100 MG/ML IJ SOLN: 100.0000 mg | Freq: Every day | INTRAMUSCULAR | Status: DC

## 2017-09-26 NOTE — ED Provider Notes (Signed)
[facility] Emergency Department Provider Note    None    (approximate)  I have reviewed the triage vital signs and the nursing notes.   HISTORY  Chief Complaint Chest Pain    HPI Alexander Lindsey is a 47 y.o. male with a history of hypertension as well as 1 pack/day smoking history presents with chief complaint of pleuritic chest pain shortness of breath.  Symptoms started yesterday progressively became worse.  Denies any cough or fevers.  States that he feels severe pain in the right side of his chest.  Denies any falls.  No trauma.  No nausea or vomiting.  No history of blood clots.  No history of heart attack.  No sick contacts.  Past Medical History:  Diagnosis Date  . Alcohol abuse   . Hypertension   . Tobacco use    History reviewed. No pertinent family history. History reviewed. No pertinent surgical history. There are no active problems to display for this patient.     Prior to Admission medications   Medication Sig Start Date End Date Taking? Authorizing Provider  chlorhexidine (PERIDEX) 0.12 % solution Use as directed 15 mLs in the mouth or throat 2 (two) times daily. Patient not taking: Reported on 09/26/2017 03/25/15   Sharman Cheek, MD  lidocaine (XYLOCAINE) 2 % solution Use as directed 20 mLs in the mouth or throat every 2 (two) hours as needed for mouth pain. Gargle and spit out Patient not taking: Reported on 09/26/2017 03/25/15   Sharman Cheek, MD  metroNIDAZOLE (FLAGYL) 500 MG tablet Take 1 tablet (500 mg total) by mouth 3 (three) times daily. Patient not taking: Reported on 09/26/2017 03/25/15   Sharman Cheek, MD  penicillin v potassium (VEETID) 250 MG/5ML solution Take 10 mLs (500 mg total) by mouth 4 (four) times daily. Patient not taking: Reported on 09/26/2017 03/25/15   Sharman Cheek, MD    Allergies Patient has no known allergies.    Social History Social History   Tobacco Use  . Smoking status: Current Every  Day Smoker    Packs/day: 0.50    Types: Cigarettes  . Smokeless tobacco: Never Used  Substance Use Topics  . Alcohol use: Yes    Alcohol/week: 12.6 oz    Types: 21 Cans of beer per week  . Drug use: No    Review of Systems Patient denies headaches, rhinorrhea, blurry vision, numbness, shortness of breath, chest pain, edema, cough, abdominal pain, nausea, vomiting, diarrhea, dysuria, fevers, rashes or hallucinations unless otherwise stated above in HPI. ____________________________________________   PHYSICAL EXAM:  VITAL SIGNS: Vitals:   09/26/17 1640 09/26/17 1731  BP: (!) 155/96 (!) 141/80  Pulse: (!) 113 (!) 116  Resp: (!) 36 (!) 27  Temp:    SpO2: 94% 97%    Constitutional: Alert and oriented.ill appearing in moderate respiratory distress Eyes: Conjunctivae are normal.  Head: Atraumatic. Nose: No congestion/rhinnorhea. Mouth/Throat: Mucous membranes are moist.   Neck: No stridor. Painless ROM.  Cardiovascular: tachycardic rate, regular rhythm. Grossly normal heart sounds.  Good peripheral circulation. Respiratory: tachypnea with use of accessory muscle but shallow respirations Gastrointestinal: Soft and nontender. No distention. No abdominal bruits. No CVA tenderness. Genitourinary:  Musculoskeletal: No lower extremity tenderness nor edema.  No joint effusions. Neurologic:  Normal speech and language. No gross focal neurologic deficits are appreciated. No facial droop Skin:  Skin is warm, dry and intact. No rash noted. Psychiatric: Mood and affect are normal. Speech and behavior are normal.  ____________________________________________   LABS (all labs ordered are listed, but only abnormal results are displayed)  Results for orders placed or performed during the hospital encounter of 09/26/17 (from the past 24 hour(s))  Basic metabolic panel     Status: Abnormal   Collection Time: 09/26/17  3:24 PM  Result Value Ref Range   Sodium 137 135 - 145 mmol/L    Potassium 4.3 3.5 - 5.1 mmol/L   Chloride 104 101 - 111 mmol/L   CO2 24 22 - 32 mmol/L   Glucose, Bld 119 (H) 65 - 99 mg/dL   BUN 12 6 - 20 mg/dL   Creatinine, Ser 1.61 0.61 - 1.24 mg/dL   Calcium 9.4 8.9 - 09.6 mg/dL   GFR calc non Af Amer >60 >60 mL/min   GFR calc Af Amer >60 >60 mL/min   Anion gap 9 5 - 15  CBC     Status: Abnormal   Collection Time: 09/26/17  3:24 PM  Result Value Ref Range   WBC 15.7 (H) 3.8 - 10.6 K/uL   RBC 5.19 4.40 - 5.90 MIL/uL   Hemoglobin 14.8 13.0 - 18.0 g/dL   HCT 04.5 40.9 - 81.1 %   MCV 87.9 80.0 - 100.0 fL   MCH 28.5 26.0 - 34.0 pg   MCHC 32.4 32.0 - 36.0 g/dL   RDW 91.4 78.2 - 95.6 %   Platelets 314 150 - 440 K/uL  Troponin I     Status: None   Collection Time: 09/26/17  3:24 PM  Result Value Ref Range   Troponin I <0.03 <0.03 ng/mL   ____________________________________________  EKG My review and personal interpretation at Time: 15:27 Indication: sob  Rate: 110  Rhythm: sinuis Axis: normal Other: nonspecific st changes, no stemi ____________________________________________  RADIOLOGY  I personally reviewed all radiographic images ordered to evaluate for the above acute complaints and reviewed radiology reports and findings.  These findings were personally discussed with the patient.  Please see medical record for radiology report.  ____________________________________________   PROCEDURES  Procedure(s) performed:  .Critical Care Performed by: Willy Eddy, MD Authorized by: Willy Eddy, MD   Critical care provider statement:    Critical care time (minutes):  40   Critical care time was exclusive of:  Separately billable procedures and treating other patients   Critical care was necessary to treat or prevent imminent or life-threatening deterioration of the following conditions:  Respiratory failure   Critical care was time spent personally by me on the following activities:  Development of treatment plan with patient  or surrogate, discussions with consultants, evaluation of patient's response to treatment, examination of patient, obtaining history from patient or surrogate, ordering and performing treatments and interventions, ordering and review of laboratory studies, ordering and review of radiographic studies, pulse oximetry, re-evaluation of patient's condition and review of old charts      Critical Care performed: yes ____________________________________________   INITIAL IMPRESSION / ASSESSMENT AND PLAN / ED COURSE  Pertinent labs & imaging results that were available during my care of the patient were reviewed by me and considered in my medical decision making (see chart for details).  DDX: Asthma, copd, CHF, pna, ptx, malignancy, Pe, anemia   Maurion L Leach is a 47 y.o. who presents to the ED with acute respiratory distress and shortness of breath as described above.  Blood work was sent for the above differential does show a leukocytosis.  Less consistent with ACS or dysrhythmia.  Does have tachycardia.  Based  on his work of breathing patient was placed on supplemental oxygen and given nebulizer treatment for his history of smoking.  Chest x-ray does show concern for consolidation but based on his respiratory distress CT imaging ordered to evaluate for PE.  The patient will be placed on continuous pulse oximetry and telemetry for monitoring.  Laboratory evaluation will be sent to evaluate for the above complaints.     Clinical Course as of Sep 26 1753  Wed Sep 26, 2017  1743 Reassessed.  Does have evidence of right lower lobe pneumonia with parapneumonic effusion.  Due to his persistent tachypnea and tachycardia increased work of breathing will trial patient on BiPAP.  Patient will be given broad-spectrum antibiotics as he is an alcoholic.  Nebulizers with some improvement.  [PR]    Clinical Course User Index [PR] Willy Eddyobinson, Ranya Fiddler, MD      ____________________________________________   FINAL CLINICAL IMPRESSION(S) / ED DIAGNOSES  Final diagnoses:  Acute respiratory failure, unspecified whether with hypoxia or hypercapnia (HCC)  Pneumonia of right lower lobe due to infectious organism (HCC)  Sepsis, due to unspecified organism [facility](HCC)      NEW MEDICATIONS STARTED DURING THIS VISIT:  New Prescriptions   No medications on file     Note:  This document was prepared using Dragon voice recognition software and may include unintentional dictation errors.    Willy Eddyobinson, Lakendria Nicastro, MD 09/26/17 1755

## 2017-09-26 NOTE — ED Notes (Signed)
Code sepsis called  1747

## 2017-09-26 NOTE — Consult Note (Signed)
PULMONARY / CRITICAL CARE MEDICINE   Name: Alexander Lindsey MRN: [id number] DOB: 12-15-1970    ADMISSION DATE:  09/26/2017 CONSULTATION DATE:  09/26/17  REFERRING MD:  Dr. Elpidio Anis  CHIEF COMPLAINT:  Abdominal pain  HISTORY OF PRESENT ILLNESS:   75M with PMH significant for alcohol and tobacco use as well as HTN. Pt drinks a twelve-pack of beer daily and had his last drink the morning of admission. Per chart review, pt presented with 2-day history of right sided pleurodynia and associated dyspnea. Workup was negative for PE. CXR with small right pleural effusion and RLL atelactisis vs infiltrate. Pt started on empiric CAP coverage and required NIV for respiratory distress with tachypnea. He was then brought to the ICU.  On presentation here, the pt's only complaint is abdominal pain, progressively worse for the past 3 days without associated n/v/d, hematochezia, or hematemesis. He describes the pain as epigastric and radiating to the back. He endorses some non-productive cough but denies chest pain and shortness of breath.   PAST MEDICAL HISTORY :  He  has a past medical history of Alcohol abuse, Hypertension, and Tobacco use.  PAST SURGICAL HISTORY: He  has no past surgical history on file.  No Known Allergies  No current facility-administered medications on file prior to encounter.    Current Outpatient Medications on File Prior to Encounter  Medication Sig  . chlorhexidine (PERIDEX) 0.12 % solution Use as directed 15 mLs in the mouth or throat 2 (two) times daily. (Patient not taking: Reported on 09/26/2017)  . lidocaine (XYLOCAINE) 2 % solution Use as directed 20 mLs in the mouth or throat every 2 (two) hours as needed for mouth pain. Gargle and spit out (Patient not taking: Reported on 09/26/2017)  . metroNIDAZOLE (FLAGYL) 500 MG tablet Take 1 tablet (500 mg total) by mouth 3 (three) times daily. (Patient not taking: Reported on 09/26/2017)  . penicillin v potassium (VEETID) 250 MG/5ML  solution Take 10 mLs (500 mg total) by mouth 4 (four) times daily. (Patient not taking: Reported on 09/26/2017)    FAMILY HISTORY:  His indicated that the status of his father is unknown.   SOCIAL HISTORY: He  reports that he has been smoking cigarettes.  He has been smoking about 0.50 packs per day. he has never used smokeless tobacco. He reports that he drinks about 12.6 oz of alcohol per week. He reports that he does not use drugs.  REVIEW OF SYSTEMS:   10 point review of systems negative except as per HPI  SUBJECTIVE:  Felling better, C/O abdominal pain, no shortness of breath or chest pain.  VITAL SIGNS: BP 127/86   Pulse (!) 102   Temp 98.3 F (36.8 C) (Oral)   Resp (!) 26   Ht 6\' 3"  (1.905 m)   Wt 74.8 kg (165 lb)   SpO2 96%   BMI 20.62 kg/m   HEMODYNAMICS:    VENTILATOR SETTINGS:    INTAKE / OUTPUT: I/O last 3 completed shifts: In: 1000 [IV Piggyback:1000] Out: -   PHYSICAL EXAMINATION: General:  Non-distressed Neuro:  A&ox4 grossly non-focal HEENT:  NCAT, PEERL Cardiovascular:  S1S2 RRR no m/g/r 2+peripheral pulses no LE edema Lungs:  CTAB, normal WOB Abdomen:  +epigastric tenderness, soft, non-distended, no organomegaly, -murphy sign Musculoskeletal:  No gross deformity Skin:  Warm dry acyanotic  LABS:  BMET Recent Labs  Lab 09/26/17 1524  NA 137  K 4.3  CL 104  CO2 24  BUN 12  CREATININE 0.81  GLUCOSE 119*    Electrolytes Recent Labs  Lab 09/26/17 1524  CALCIUM 9.4    CBC Recent Labs  Lab 09/26/17 1524  WBC 15.7*  HGB 14.8  HCT 45.7  PLT 314    Coag's No results for input(s): APTT, INR in the last 168 hours.  Sepsis Markers Recent Labs  Lab 09/26/17 1759  LATICACIDVEN 0.8    ABG No results for input(s): PHART, PCO2ART, PO2ART in the last 168 hours.  Liver Enzymes No results for input(s): AST, ALT, ALKPHOS, BILITOT, ALBUMIN in the last 168 hours.  Cardiac Enzymes Recent Labs  Lab 09/26/17 1524  TROPONINI  <0.03    Glucose Recent Labs  Lab 09/26/17 1903  GLUCAP 144*    Imaging Dg Chest 2 View  Result Date: 09/26/2017 CLINICAL DATA:  Chest pain EXAM: CHEST  2 VIEW COMPARISON:  04/08/2013 FINDINGS: Right lower lobe airspace disease with associated volume loss. Small right effusion. Left lung clear.  Negative for heart failure IMPRESSION: Right lower lobe atelectasis/infiltrate and small right effusion. Electronically Signed   By: Marlan Palau M.D.   On: 09/26/2017 16:00   Ct Angio Chest Pe W And/or Wo Contrast  Result Date: 09/26/2017 CLINICAL DATA:  Chest pain since last night. EXAM: CT ANGIOGRAPHY CHEST WITH CONTRAST TECHNIQUE: Multidetector CT imaging of the chest was performed using the standard protocol during bolus administration of intravenous contrast. Multiplanar CT image reconstructions and MIPs were obtained to evaluate the vascular anatomy. CONTRAST:  150 mL ISOVUE-370 IOPAMIDOL (ISOVUE-370) INJECTION 76% COMPARISON:  Chest radiograph earlier today. FINDINGS: The first attempt at PE study was suboptimal. The dose was repeated and second attempt was worse. There is suboptimal opacification for a diagnosis of pulmonary embolus. Cardiovascular: Satisfactory opacification of the pulmonary arteries to the main pulmonary artery level. No evidence of proximal pulmonary embolism. Normal heart size, with LEFT ventricular hypertrophy. No pericardial effusion. Mediastinum/Nodes: No enlarged mediastinal, hilar, or axillary lymph nodes. Thyroid gland, trachea, and esophagus demonstrate no significant findings. Lungs/Pleura: RIGHT lower lobe pneumonia with consolidation volume loss. RIGHT pleural effusion. LEFT lung clear. Upper Abdomen: No acute abnormality. Musculoskeletal: No chest wall abnormality. No acute or significant osseous findings. Review of the MIP images confirms the above findings. IMPRESSION: Suboptimal CTA examination for the detection pulmonary emboli. See discussion above. There is no  evidence for pulmonary embolus to the main pulmonary artery, or RIGHT or LEFT proximal PA segments. RIGHT lower lobe pneumonia with effusion. Electronically Signed   By: Elsie Stain M.D.   On: 09/26/2017 17:35   STUDIES:  CT Angio Chest 02/6>>Suboptimal CTA examination for the detection pulmonary emboli. See discussion above. There is no evidence for pulmonary embolus to the main pulmonary artery, or RIGHT or LEFT proximal PA segments. RIGHT lower lobe pneumonia with effusion.  CULTURES: BC 2/6  ANTIBIOTICS: Ceftriaxone 2/6>> Azithromycin 2/6>>  SIGNIFICANT EVENTS: 2/6 Admitted to ICU  LINES/TUBES: PIV  DISCUSSION: 37M with hx of heavy alcohol use presents with RLL infiltrate concerning for PNA, on empiric coverage now. Will add anaerobic coverage for possible aspiration. Epigastric pain concerning for pancreatitis, checking lipase and RUQ u/s.   ASSESSMENT / PLAN:  Acute hypoxic respiratory failure with RLL opacity CAP vs. Aspiration pneumonitis Acute epigastric pain, suspicion for pancreatitis  Chronic alcohol abuse Tobacco use Continue NIV for now, wean to [place] as able Switch ceftriaxone to Zosyn to cover anaerobes Trend PCT Follow lactate, blood cultures spO2 goal >92% Check lipase, hepatic function panel RUQ u/s  KUB Monitor closely for  alcohol withdrawal Provided smoking cessation counseling Vitamin and electrolyte replacement as needed  CIWA protocol  Continue thiamine, folic acid, and mvi   Elinor Dodgeoug [place], PA-S  Sonda Rumbleana Meliton Samad, AGNP  Pulmonary/Critical Care Pager [phone] (please enter 7 digits) PCCM Consult Pager [phone] (please enter 7 digits)

## 2017-09-26 NOTE — ED Triage Notes (Addendum)
Pt has been experiencing chest pain since last night that has progressively gotten worse. Now experiencing stabbing chest pain that is worse with breathing and feels SOB. Patient moaning and groaning in triage and also diaphoretic

## 2017-09-26 NOTE — ED Triage Notes (Signed)
FIRST NURSE NOTE-HERE for chest pain and SHOB. Pulled next for EKG. sats 95 % at check in desk, HR 113

## 2017-09-26 NOTE — ED Notes (Signed)
Pt presents to ED treatment room tachypnic and diaphoretic. Pt appears to be in increased discomfort and is unable to sit back in bed without reports of increased pain and SOB. Pt appears to be in distress at this time. MD made aware and headed to bedside.

## 2017-09-26 NOTE — ED Notes (Signed)
Patient transported to CT 

## 2017-09-26 NOTE — Progress Notes (Signed)
ANTIBIOTIC CONSULT NOTE - INITIAL  Pharmacy Consult for Zosyn  Indication: pneumonia  No Known Allergies  Patient Measurements: Height: 6\' 3"  (190.5 cm) Weight: 191 lb 5.8 oz (86.8 kg) IBW/kg (Calculated) : 84.5 Adjusted Body Weight:   Vital Signs: Temp: 97.9 F (36.6 C) (02/06 1910) Temp Source: Axillary (02/06 1910) BP: 122/84 (02/06 2000) Pulse Rate: 85 (02/06 2000) Intake/Output from previous day: No intake/output data recorded. Intake/Output from this shift: No intake/output data recorded.  Labs: Recent Labs    09/26/17 1524  WBC 15.7*  HGB 14.8  PLT 314  CREATININE 0.81   Estimated Creatinine Clearance: 136.2 mL/min (by C-G formula based on SCr of 0.81 mg/dL). No results for input(s): VANCOTROUGH, VANCOPEAK, VANCORANDOM, GENTTROUGH, GENTPEAK, GENTRANDOM, TOBRATROUGH, TOBRAPEAK, TOBRARND, AMIKACINPEAK, AMIKACINTROU, AMIKACIN in the last 72 hours.   Microbiology: No results found for this or any previous visit (from the past 720 hour(s)).  Medical History: Past Medical History:  Diagnosis Date  . Alcohol abuse   . Hypertension   . Tobacco use     Medications:  Medications Prior to Admission  Medication Sig Dispense Refill Last Dose  . chlorhexidine (PERIDEX) 0.12 % solution Use as directed 15 mLs in the mouth or throat 2 (two) times daily. (Patient not taking: Reported on 09/26/2017) 120 mL 0 -- at --  . lidocaine (XYLOCAINE) 2 % solution Use as directed 20 mLs in the mouth or throat every 2 (two) hours as needed for mouth pain. Gargle and spit out (Patient not taking: Reported on 09/26/2017) 100 mL 0 -- at --  . metroNIDAZOLE (FLAGYL) 500 MG tablet Take 1 tablet (500 mg total) by mouth 3 (three) times daily. (Patient not taking: Reported on 09/26/2017) 30 tablet 0 -- at --  . penicillin v potassium (VEETID) 250 MG/5ML solution Take 10 mLs (500 mg total) by mouth 4 (four) times daily. (Patient not taking: Reported on 09/26/2017) 300 mL 0 -- at --   Assessment: CrCl  = 136.2 ml/min   Goal of Therapy:  resolution of infection  Plan:  Expected duration 7 days with resolution of temperature and/or normalization of WBC   Zosyn 3.375 mg IV Q8H EI ordered to start on 2/6 @ 21:00.   Griffith Santilli D 09/26/2017,8:48 PM

## 2017-09-26 NOTE — ED Notes (Signed)
RN called lab to inform of add on lab work.

## 2017-09-26 NOTE — H&P (Addendum)
[facility] - [place] at [facility]   PATIENT NAME: Alexander Lindsey    MR#:  [id number]  DATE OF BIRTH:  01/31/1971  DATE OF ADMISSION:  09/26/2017  PRIMARY CARE PHYSICIAN: Patient, No Pcp Per   REQUESTING/REFERRING PHYSICIAN: Dr. Roxan Hockey  CHIEF COMPLAINT:   Chief Complaint  Patient presents with  . Chest Pain    HISTORY OF PRESENT ILLNESS:  Alexander Lindsey  is a 47 y.o. male with a known history of hypertension, alcohol abuse, tobacco abuse presents to the emergency room due to right-sided chest pain and shortness of breath.  CT scan shows no PE but shows right lower lobe infiltrate with pleural effusion.  Patient is breathing 35-40 times and will need BiPAP and admit to ICU. She does not take any home medications. No vomiting or dysphagia  PAST MEDICAL HISTORY:   Past Medical History:  Diagnosis Date  . Alcohol abuse   . Hypertension   . Tobacco use     PAST SURGICAL HISTORY:  History reviewed. No pertinent surgical history.  SOCIAL HISTORY:   Social History   Tobacco Use  . Smoking status: Current Every Day Smoker    Packs/day: 0.50    Types: Cigarettes  . Smokeless tobacco: Never Used  Substance Use Topics  . Alcohol use: Yes    Alcohol/week: 12.6 oz    Types: 21 Cans of beer per week    FAMILY HISTORY:   Family History  Problem Relation Age of Onset  . CAD Father     DRUG ALLERGIES:  No Known Allergies  REVIEW OF SYSTEMS:   Review of Systems  Constitutional: Positive for malaise/fatigue. Negative for chills and fever.  HENT: Negative for sore throat.   Eyes: Negative for blurred vision, double vision and pain.  Respiratory: Positive for cough and shortness of breath. Negative for hemoptysis and wheezing.   Cardiovascular: Positive for chest pain. Negative for palpitations, orthopnea and leg swelling.  Gastrointestinal: Negative for abdominal pain, constipation, diarrhea, heartburn, nausea and vomiting.  Genitourinary:  Negative for dysuria and hematuria.  Musculoskeletal: Negative for back pain and joint pain.  Skin: Negative for rash.  Neurological: Positive for weakness. Negative for sensory change, speech change, focal weakness and headaches.  Endo/Heme/Allergies: Does not bruise/bleed easily.  Psychiatric/Behavioral: Negative for depression. The patient is not nervous/anxious.     MEDICATIONS AT HOME:   Prior to Admission medications   Medication Sig Start Date End Date Taking? Authorizing Provider  chlorhexidine (PERIDEX) 0.12 % solution Use as directed 15 mLs in the mouth or throat 2 (two) times daily. Patient not taking: Reported on 09/26/2017 03/25/15   Sharman Cheek, MD  lidocaine (XYLOCAINE) 2 % solution Use as directed 20 mLs in the mouth or throat every 2 (two) hours as needed for mouth pain. Gargle and spit out Patient not taking: Reported on 09/26/2017 03/25/15   Sharman Cheek, MD  metroNIDAZOLE (FLAGYL) 500 MG tablet Take 1 tablet (500 mg total) by mouth 3 (three) times daily. Patient not taking: Reported on 09/26/2017 03/25/15   Sharman Cheek, MD  penicillin v potassium (VEETID) 250 MG/5ML solution Take 10 mLs (500 mg total) by mouth 4 (four) times daily. Patient not taking: Reported on 09/26/2017 03/25/15   Sharman Cheek, MD     VITAL SIGNS:  Blood pressure (!) 141/80, pulse (!) 116, temperature 98.3 F (36.8 C), temperature source Oral, resp. rate (!) 27, height 6\' 3"  (1.905 m), weight 74.8 kg (165 lb), SpO2 97 %.  PHYSICAL EXAMINATION:  Physical Exam  GENERAL:  47 y.o.-year-old patient lying in the bed with severe respiratory distress EYES: Pupils equal, round, reactive to light and accommodation. No scleral icterus. Extraocular muscles intact.  HEENT: Head atraumatic, normocephalic. Oropharynx and nasopharynx clear. No oropharyngeal erythema, moist oral mucosa  NECK:  Supple, no jugular venous distention. No thyroid enlargement, no tenderness.  LUNGS: Shallow rapid  breathing. CARDIOVASCULAR: S1, S2 normal. No murmurs, rubs, or gallops.  ABDOMEN: Soft, nontender, nondistended. Bowel sounds present. No organomegaly or mass.  EXTREMITIES: No pedal edema, cyanosis, or clubbing. + 2 pedal & radial pulses b/l.   NEUROLOGIC: Cranial nerves II through XII are intact. No focal Motor or sensory deficits appreciated b/l PSYCHIATRIC: The patient is alert and oriented x 3.  Anxious  SKIN: No obvious rash, lesion, or ulcer.   LABORATORY PANEL:   CBC Recent Labs  Lab 09/26/17 1524  WBC 15.7*  HGB 14.8  HCT 45.7  PLT 314   ------------------------------------------------------------------------------------------------------------------  Chemistries  Recent Labs  Lab 09/26/17 1524  NA 137  K 4.3  CL 104  CO2 24  GLUCOSE 119*  BUN 12  CREATININE 0.81  CALCIUM 9.4   ------------------------------------------------------------------------------------------------------------------  Cardiac Enzymes Recent Labs  Lab 09/26/17 1524  TROPONINI <0.03   ------------------------------------------------------------------------------------------------------------------  RADIOLOGY:  Dg Chest 2 View  Result Date: 09/26/2017 CLINICAL DATA:  Chest pain EXAM: CHEST  2 VIEW COMPARISON:  04/08/2013 FINDINGS: Right lower lobe airspace disease with associated volume loss. Small right effusion. Left lung clear.  Negative for heart failure IMPRESSION: Right lower lobe atelectasis/infiltrate and small right effusion. Electronically Signed   By: Marlan Palauharles  Clark M.D.   On: 09/26/2017 16:00   Ct Angio Chest Pe W And/or Wo Contrast  Result Date: 09/26/2017 CLINICAL DATA:  Chest pain since last night. EXAM: CT ANGIOGRAPHY CHEST WITH CONTRAST TECHNIQUE: Multidetector CT imaging of the chest was performed using the standard protocol during bolus administration of intravenous contrast. Multiplanar CT image reconstructions and MIPs were obtained to evaluate the vascular anatomy.  CONTRAST:  150 mL ISOVUE-370 IOPAMIDOL (ISOVUE-370) INJECTION 76% COMPARISON:  Chest radiograph earlier today. FINDINGS: The first attempt at PE study was suboptimal. The dose was repeated and second attempt was worse. There is suboptimal opacification for a diagnosis of pulmonary embolus. Cardiovascular: Satisfactory opacification of the pulmonary arteries to the main pulmonary artery level. No evidence of proximal pulmonary embolism. Normal heart size, with LEFT ventricular hypertrophy. No pericardial effusion. Mediastinum/Nodes: No enlarged mediastinal, hilar, or axillary lymph nodes. Thyroid gland, trachea, and esophagus demonstrate no significant findings. Lungs/Pleura: RIGHT lower lobe pneumonia with consolidation volume loss. RIGHT pleural effusion. LEFT lung clear. Upper Abdomen: No acute abnormality. Musculoskeletal: No chest wall abnormality. No acute or significant osseous findings. Review of the MIP images confirms the above findings. IMPRESSION: Suboptimal CTA examination for the detection pulmonary emboli. See discussion above. There is no evidence for pulmonary embolus to the main pulmonary artery, or RIGHT or LEFT proximal PA segments. RIGHT lower lobe pneumonia with effusion. Electronically Signed   By: Elsie StainJohn T Curnes M.D.   On: 09/26/2017 17:35     IMPRESSION AND PLAN:   *Right lower lobe community acquired pneumonia with pleural effusion.  Patient will be started on IV ceftriaxone and azithromycin.  Due to wheezing will also start him on IV steroids and scheduled nebulizers.  BiPAP support.  Patient is tachypneic breathing up to 35-40 times per minute.  Wean to nasal cannula as tolerated.  Discussed with Dr. Lonn Georgiaonforti in the ICU.  Critically  ill.  Sepsis POA  *Acute hypoxic respiratory failure due to pneumonia.  *Hypertension.  Not on medications.  Will add IV hydralazine as needed.  If consistently elevated can start Norvasc.  *Alcohol abuse.  Will add CIWA protocol.  DVT  prophylaxis with Lovenox  All the records are reviewed and case discussed with ED provider. Management plans discussed with the patient, family and they are in agreement.  CODE STATUS: FULL CODE  TOTAL TIME TAKING CARE OF THIS PATIENT: 40 minutes.   Molinda Bailiff Wilson Sample M.D on 09/26/2017 at 5:58 PM  Between 7am to 6pm - Pager - [phone]  After 6pm go to [email or web address] - password [email or web address]  [facility] [place] [facility]  Office  [phone]  CC: Primary care physician; Patient, No Pcp Per  Note: This dictation was prepared with Dragon dictation along with smaller phrase technology. Any transcriptional errors that result from this process are unintentional.

## 2017-09-27 ENCOUNTER — Inpatient Hospital Stay: Payer: Medicaid Other

## 2017-09-27 ENCOUNTER — Other Ambulatory Visit: Payer: Self-pay

## 2017-09-27 DIAGNOSIS — J9601 Acute respiratory failure with hypoxia: Secondary | ICD-10-CM

## 2017-09-27 LAB — BASIC METABOLIC PANEL
ANION GAP: 8 (ref 5–15)
BUN: 10 mg/dL (ref 6–20)
CALCIUM: 8.6 mg/dL — AB (ref 8.9–10.3)
CO2: 23 mmol/L (ref 22–32)
Chloride: 105 mmol/L (ref 101–111)
Creatinine, Ser: 0.78 mg/dL (ref 0.61–1.24)
GFR calc Af Amer: >60 mL/min (ref >60)
GLUCOSE: 150 mg/dL — AB (ref 65–99)
POTASSIUM: 4.2 mmol/L (ref 3.5–5.1)
Sodium: 136 mmol/L (ref 135–145)

## 2017-09-27 LAB — CBC
HEMATOCRIT: 42.1 % (ref 40.0–52.0)
HEMOGLOBIN: 13.7 g/dL (ref 13.0–18.0)
MCH: 28.7 pg (ref 26.0–34.0)
MCHC: 32.5 g/dL (ref 32.0–36.0)
MCV: 88.2 fL (ref 80.0–100.0)
Platelets: 255 10*3/uL (ref 150–440)
RBC: 4.77 MIL/uL (ref 4.40–5.90)
RDW: 13.1 % (ref 11.5–14.5)
WBC: 14.2 10*3/uL — ABNORMAL HIGH (ref 3.8–10.6)

## 2017-09-27 LAB — PROCALCITONIN: PROCALCITONIN: 2 ng/mL

## 2017-09-27 LAB — HIV ANTIBODY (ROUTINE TESTING W REFLEX): HIV SCREEN 4TH GENERATION: NONREACTIVE

## 2017-09-27 NOTE — Progress Notes (Signed)
Pt has elevated RR and tachycardia. (see flow sheet). PRN Ativan given, MD Luberta MutterKonidena notified.

## 2017-09-27 NOTE — Progress Notes (Signed)
[facility] - [place] at [facility]   PATIENT NAME: Alexander Lindsey    MR#:  [id number]  DATE OF BIRTH:  05-03-1971  SUBJECTIVE: Admitted for acute respiratory failure due to pneumonia, complaints of cough, pleuritic chest pain on the right side, right upper quadrant pain.  Abdominal ultrasound did not show any pancreatitis or gallbladder problems.  We will discharge the patient on regular diet.  Patient drinks about 12 packs of beers daily, last drink was yesterday morning.  Patient denies any withdrawal symptoms like shaking or hallucinations.  Denies any nausea or vomiting.   requesting pain medicine for pleuritic chest pain  CHIEF COMPLAINT:   Chief Complaint  Patient presents with  . Chest Pain    REVIEW OF SYSTEMS:   ROS CONSTITUTIONAL: No fever, fatigue or weakness.  EYES: No blurred or double vision.  EARS, NOSE, AND THROAT: No tinnitus or ear pain.  RESPIRATORY: No cough, shortness of breath, wheezing or hemoptysis.  CARDIOVASCULAR: No chest pain, orthopnea, edema.  GASTROINTESTINAL: No nausea, vomiting, diarrhea or abdominal pain.  GENITOURINARY: No dysuria, hematuria.  ENDOCRINE: No polyuria, nocturia,  HEMATOLOGY: No anemia, easy bruising or bleeding SKIN: No rash or lesion. MUSCULOSKELETAL:right Pleuritic chest pain.Marland Kitchen   NEUROLOGIC: No tingling, numbness, weakness.  PSYCHIATRY: No anxiety or depression.   DRUG ALLERGIES:  No Known Allergies  VITALS:  Blood pressure (!) 128/95, pulse 80, temperature 98.1 F (36.7 C), temperature source Axillary, resp. rate (!) 24, height 6\' 3"  (1.905 m), weight 85.1 kg (187 lb 9.8 oz), SpO2 97 %.  PHYSICAL EXAMINATION:  GENERAL:  46 y.o.-year-old patient lying in the bed with no acute distress.  EYES: Pupils equal, round, reactive to ligh.. No scleral icterus. Extraocular muscles intact.  HEENT: Head atraumatic, normocephalic. Oropharynx and nasopharynx clear.  NECK:  Supple, no jugular venous distention.  No thyroid enlargement, no tenderness.  LUNGS: Decreased breath sound bilaterally, no wheezing.  CARDIOVASCULAR: S1, S2 normal. No murmurs, rubs, or gallops.  ABDOMEN: Soft, nontender, nondistended. Bowel sounds present. No organomegaly or mass.  EXTREMITIES: No pedal edema, cyanosis, or clubbing.  NEUROLOGIC: Cranial nerves II through XII are intact. Muscle strength 5/5 in all extremities. Sensation intact. Gait not checked.  PSYCHIATRIC: The patient is alert and oriented x 3.  SKIN: No obvious rash, lesion, or ulcer.    LABORATORY PANEL:   CBC Recent Labs  Lab 09/27/17 0524  WBC 14.2*  HGB 13.7  HCT 42.1  PLT 255   ------------------------------------------------------------------------------------------------------------------  Chemistries  Recent Labs  Lab 09/26/17 2015 09/27/17 0524  NA  --  136  K  --  4.2  CL  --  105  CO2  --  23  GLUCOSE  --  150*  BUN  --  10  CREATININE  --  0.78  CALCIUM  --  8.6*  AST 17  --   ALT 22  --   ALKPHOS 57  --   BILITOT 0.4  --    ------------------------------------------------------------------------------------------------------------------  Cardiac Enzymes Recent Labs  Lab 09/26/17 1524  TROPONINI <0.03   ------------------------------------------------------------------------------------------------------------------  RADIOLOGY:  Dg Chest 2 View  Result Date: 09/26/2017 CLINICAL DATA:  Chest pain EXAM: CHEST  2 VIEW COMPARISON:  04/08/2013 FINDINGS: Right lower lobe airspace disease with associated volume loss. Small right effusion. Left lung clear.  Negative for heart failure IMPRESSION: Right lower lobe atelectasis/infiltrate and small right effusion. Electronically Signed   By: Marlan Palau M.D.   On: 09/26/2017 16:00   Dg Abd 1  View  Result Date: 09/26/2017 CLINICAL DATA:  Abdominal pain. EXAM: ABDOMEN - 1 VIEW COMPARISON:  None. FINDINGS: There is contrast in the renal collecting systems and bladder. The bones  are normal. No bowel obstruction. No other acute abnormalities. IMPRESSION: No acute abnormalities. Electronically Signed   By: Gerome Sam III M.D   On: 09/26/2017 20:35   Ct Angio Chest Pe W And/or Wo Contrast  Result Date: 09/26/2017 CLINICAL DATA:  Chest pain since last night. EXAM: CT ANGIOGRAPHY CHEST WITH CONTRAST TECHNIQUE: Multidetector CT imaging of the chest was performed using the standard protocol during bolus administration of intravenous contrast. Multiplanar CT image reconstructions and MIPs were obtained to evaluate the vascular anatomy. CONTRAST:  150 mL ISOVUE-370 IOPAMIDOL (ISOVUE-370) INJECTION 76% COMPARISON:  Chest radiograph earlier today. FINDINGS: The first attempt at PE study was suboptimal. The dose was repeated and second attempt was worse. There is suboptimal opacification for a diagnosis of pulmonary embolus. Cardiovascular: Satisfactory opacification of the pulmonary arteries to the main pulmonary artery level. No evidence of proximal pulmonary embolism. Normal heart size, with LEFT ventricular hypertrophy. No pericardial effusion. Mediastinum/Nodes: No enlarged mediastinal, hilar, or axillary lymph nodes. Thyroid gland, trachea, and esophagus demonstrate no significant findings. Lungs/Pleura: RIGHT lower lobe pneumonia with consolidation volume loss. RIGHT pleural effusion. LEFT lung clear. Upper Abdomen: No acute abnormality. Musculoskeletal: No chest wall abnormality. No acute or significant osseous findings. Review of the MIP images confirms the above findings. IMPRESSION: Suboptimal CTA examination for the detection pulmonary emboli. See discussion above. There is no evidence for pulmonary embolus to the main pulmonary artery, or RIGHT or LEFT proximal PA segments. RIGHT lower lobe pneumonia with effusion. Electronically Signed   By: Elsie Stain M.D.   On: 09/26/2017 17:35   US Abdomen Limited Ruq  Result Date: 09/27/2017 CLINICAL DATA:  Epigastric pain radiating to  the back for the past 3 days. EXAM: ULTRASOUND ABDOMEN LIMITED RIGHT UPPER QUADRANT COMPARISON:  None. FINDINGS: Gallbladder: No gallstones or wall thickening visualized. No sonographic Murphy sign noted by sonographer. Common bile duct: Diameter: 2 mm, normal. Liver: No focal lesion identified. Increased in parenchymal echogenicity. Portal vein is patent on color Doppler imaging with normal direction of blood flow towards the liver. IMPRESSION: 1. No acute abnormality. 2. Hepatic steatosis. Electronically Signed   By: Obie Dredge M.D.   On: 09/27/2017 09:34    EKG:   Orders placed or performed during the hospital encounter of 09/26/17  . ED EKG within 10 minutes  . ED EKG within 10 minutes  . EKG 12-Lead  . EKG 12-Lead    ASSESSMENT AND PLAN:  1.  Acute respiratory failure secondary to community-acquired pneumonia on right side: On empiric antibiotics with IV vancomycin, Zosyn, oxygen, patient has right pleuritic chest pain: Continue Percocet for pain control.  Discontinue fentanyl that he was getting in ICU.  Patient also is on Toradol 30 mg every 6 hours as needed. 2.  Heavy alcohol use, watch for withdrawal symptoms.  Ultrasound of abdomen did not show any acute problem.  Continue Ativan for withdrawal protocol, 3.   COPD exacerbation but no wheezing now, continue small dose IV steroids, bronchodilators, Check ambulatory oxygen saturation, likely discharge tomorrow home.    All the records are reviewed and case discussed with Care Management/Social Workerr. Management plans discussed with the patient, family and they are in agreement.  CODE STATUS: full  TOTAL TIME TAKING CARE OF THIS PATIENT: 35 minutes.   POSSIBLE D/C IN 1-2 DAYS,  DEPENDING ON CLINICAL CONDITION.   Katha HammingSnehalatha Toria Monte M.D on 09/27/2017 at 10:06 AM  Between 7am to 6pm - Pager - [phone]  After 6pm go to [email or web address] - password [email or web address]  [facility] [place] [facility]  Office   [phone]  CC: Primary care physician; Patient, No Pcp Per   Note: This dictation was prepared with Dragon dictation along with smaller phrase technology. Any transcriptional errors that result from this process are unintentional.

## 2017-09-28 LAB — PROCALCITONIN: Procalcitonin: 1.54 ng/mL

## 2017-09-28 MED ORDER — PREDNISONE 10 MG (21) PO TBPK: ORAL_TABLET | ORAL | Status: DC

## 2017-09-28 MED ORDER — AZITHROMYCIN 250 MG PO TABS: ORAL_TABLET | ORAL | 0 refills | Status: DC

## 2017-09-28 MED ORDER — LORAZEPAM 0.5 MG PO TABS: 0.5000 mg | ORAL_TABLET | Freq: Three times a day (TID) | ORAL | 0 refills | Status: DC

## 2017-09-28 MED ORDER — AMOXICILLIN-POT CLAVULANATE 875-125 MG PO TABS: 1.0000 | ORAL_TABLET | Freq: Two times a day (BID) | ORAL | 0 refills | Status: DC

## 2017-09-28 MED ORDER — ALBUTEROL SULFATE HFA 108 (90 BASE) MCG/ACT IN AERS: 2.0000 | INHALATION_SPRAY | Freq: Four times a day (QID) | RESPIRATORY_TRACT | 2 refills | Status: DC | PRN

## 2017-09-28 NOTE — Discharge Summary (Signed)
Alexander Lindsey, is a 47 y.o. male  DOB [birth year]  MRN [id number].  Admission date:  09/26/2017  Admitting Physician  Milagros LollSrikar Sudini, MD  Discharge Date:  09/28/2017   Primary MD  Patient, No Pcp Per  Recommendations for primary care physician for things to follow:   [facility] with [facility] as necessary.   Admission Diagnosis  Acute respiratory failure, unspecified whether with hypoxia or hypercapnia (HCC) [J96.00] Sepsis, due to unspecified organism (HCC) [A41.9] Pneumonia of right lower lobe due to infectious organism [facility](HCC) [J18.1]   Discharge Diagnosis  Acute respiratory failure, unspecified whether with hypoxia or hypercapnia (HCC) [J96.00] Sepsis, due to unspecified organism (HCC) [A41.9] Pneumonia of right lower lobe due to infectious organism [facility](HCC) [J18.1]   Active Problems:   Pneumonia      Past Medical History:  Diagnosis Date  . Alcohol abuse   . Hypertension   . Tobacco use     History reviewed. No pertinent surgical history.     History of present illness and  Hospital Course:     Kindly see H&P for history of present illness and admission details, please review complete Labs, Consult reports and Test reports for all details in brief  HPI  from the history and physical done on the day of admission 47 year old male patient with essential hypertension, alcohol abuse, tobacco abuse came in because of right-sided chest pain, shortness of breath, patient found to have tachypnea with respiratory rate 30-40 times and admitted to ICU for BiPAP support.   Hospital Course  Acute respiratory failure with right lower lobe pneumonia: Admitted to ICU, started on BiPAP, started on antibiotics with Zosyn, vancomycin, patient came over the BiPAP, transferred to regular floor, right now he is on 2 L of  oxygen saturation 95%.  We will check her ambulatory oxygen saturation to see if he qualifies for home oxygen.  Discharge home with Augmentin, azithromycin to finish the course of pneumonia treatment, continue prednisone, albuterol inhaler.  2.  Heavy alcohol abuse, patient started on a  CiWAprotocol, will go home with a short course of Ativan.  No further tremors.  Never had tremors or DT.  Patient to quit drinking. 3.  Heavy tobacco abuse: No wheezing now.  did have some cough, pleuritic chest pain on the right side, CT angios chest did not show any PE.     Discharge Condition: Stable   Follow UP      Discharge Instructions  and  Discharge Medications      Allergies as of 09/28/2017   No Known Allergies     Medication List    STOP taking these medications   metroNIDAZOLE 500 MG tablet Commonly known as:  FLAGYL   penicillin v potassium 250 MG/5ML solution Commonly known as:  VEETID     TAKE these medications   albuterol 108 (90 Base) MCG/ACT inhaler Commonly known as:  PROVENTIL HFA;VENTOLIN HFA Inhale 2 puffs into the lungs every 6 (six) hours as needed for wheezing or shortness of breath.   amoxicillin-clavulanate 875-125 MG tablet Commonly known as:  AUGMENTIN Take 1 tablet by mouth 2 (two) times daily for 14 days.   azithromycin 250 MG tablet Commonly known as:  ZITHROMAX Z-PAK Take 2 tablets (500 mg) on  Day 1,  followed by 1 tablet (250 mg) once daily on Days 2 through 5.   chlorhexidine 0.12 % solution Commonly known as:  PERIDEX Use as directed 15 mLs in the mouth or throat 2 (two) times  daily.   lidocaine 2 % solution Commonly known as:  XYLOCAINE Use as directed 20 mLs in the mouth or throat every 2 (two) hours as needed for mouth pain. Gargle and spit out   LORazepam 0.5 MG tablet Commonly known as:  ATIVAN Take 1 tablet (0.5 mg total) by mouth every 8 (eight) hours.   predniSONE 10 MG (21) Tbpk tablet Commonly known as:  STERAPRED UNI-PAK 21  TAB Taper by 10 mg po daily         Diet and Activity recommendation: See Discharge Instructions above   Consults obtained -intensivist  Major procedures and Radiology Reports - PLEASE review detailed and final reports for all details, in brief -      Dg Chest 2 View  Result Date: 09/26/2017 CLINICAL DATA:  Chest pain EXAM: CHEST  2 VIEW COMPARISON:  04/08/2013 FINDINGS: Right lower lobe airspace disease with associated volume loss. Small right effusion. Left lung clear.  Negative for heart failure IMPRESSION: Right lower lobe atelectasis/infiltrate and small right effusion. Electronically Signed   By: Marlan Palau M.D.   On: 09/26/2017 16:00   Dg Abd 1 View  Result Date: 09/26/2017 CLINICAL DATA:  Abdominal pain. EXAM: ABDOMEN - 1 VIEW COMPARISON:  None. FINDINGS: There is contrast in the renal collecting systems and bladder. The bones are normal. No bowel obstruction. No other acute abnormalities. IMPRESSION: No acute abnormalities. Electronically Signed   By: Gerome Sam III M.D   On: 09/26/2017 20:35   Ct Angio Chest Pe W And/or Wo Contrast  Result Date: 09/26/2017 CLINICAL DATA:  Chest pain since last night. EXAM: CT ANGIOGRAPHY CHEST WITH CONTRAST TECHNIQUE: Multidetector CT imaging of the chest was performed using the standard protocol during bolus administration of intravenous contrast. Multiplanar CT image reconstructions and MIPs were obtained to evaluate the vascular anatomy. CONTRAST:  150 mL ISOVUE-370 IOPAMIDOL (ISOVUE-370) INJECTION 76% COMPARISON:  Chest radiograph earlier today. FINDINGS: The first attempt at PE study was suboptimal. The dose was repeated and second attempt was worse. There is suboptimal opacification for a diagnosis of pulmonary embolus. Cardiovascular: Satisfactory opacification of the pulmonary arteries to the main pulmonary artery level. No evidence of proximal pulmonary embolism. Normal heart size, with LEFT ventricular hypertrophy. No pericardial  effusion. Mediastinum/Nodes: No enlarged mediastinal, hilar, or axillary lymph nodes. Thyroid gland, trachea, and esophagus demonstrate no significant findings. Lungs/Pleura: RIGHT lower lobe pneumonia with consolidation volume loss. RIGHT pleural effusion. LEFT lung clear. Upper Abdomen: No acute abnormality. Musculoskeletal: No chest wall abnormality. No acute or significant osseous findings. Review of the MIP images confirms the above findings. IMPRESSION: Suboptimal CTA examination for the detection pulmonary emboli. See discussion above. There is no evidence for pulmonary embolus to the main pulmonary artery, or RIGHT or LEFT proximal PA segments. RIGHT lower lobe pneumonia with effusion. Electronically Signed   By: Elsie Stain M.D.   On: 09/26/2017 17:35   US Abdomen Limited Ruq  Result Date: 09/27/2017 CLINICAL DATA:  Epigastric pain radiating to the back for the past 3 days. EXAM: ULTRASOUND ABDOMEN LIMITED RIGHT UPPER QUADRANT COMPARISON:  None. FINDINGS: Gallbladder: No gallstones or wall thickening visualized. No sonographic Murphy sign noted by sonographer. Common bile duct: Diameter: 2 mm, normal. Liver: No focal lesion identified. Increased in parenchymal echogenicity. Portal vein is patent on color Doppler imaging with normal direction of blood flow towards the liver. IMPRESSION: 1. No acute abnormality. 2. Hepatic steatosis. Electronically Signed   By: Obie Dredge M.D.   On: 09/27/2017  09:34    Micro Results    Recent Results (from the past 240 hour(s))  Blood Culture (routine x 2)     Status: None (Preliminary result)   Collection Time: 09/26/17  5:59 PM  Result Value Ref Range Status   Specimen Description BLOOD BLOOD RIGHT FOREARM  Final   Special Requests   Final    BOTTLES DRAWN AEROBIC AND ANAEROBIC Blood Culture adequate volume   Culture   Final    NO GROWTH 2 DAYS Performed at [facility], [address] [place], [place] [address]    Report Status PENDING   Incomplete  Blood Culture (routine x 2)     Status: None (Preliminary result)   Collection Time: 09/26/17  5:59 PM  Result Value Ref Range Status   Specimen Description BLOOD BLOOD RIGHT FOREARM  Final   Special Requests   Final    BOTTLES DRAWN AEROBIC AND ANAEROBIC Blood Culture adequate volume   Culture   Final    NO GROWTH 2 DAYS Performed at [facility], [address] [place], [place] [address]    Report Status PENDING  Incomplete  MRSA PCR Screening     Status: None   Collection Time: 09/26/17  5:59 PM  Result Value Ref Range Status   MRSA by PCR NEGATIVE NEGATIVE Final    Comment:        The GeneXpert MRSA Assay (FDA approved for NASAL specimens only), is one component of a comprehensive MRSA colonization surveillance program. It is not intended to diagnose MRSA infection nor to guide or monitor treatment for MRSA infections. Performed at [facility], [address] [place], [place] [address]        Today   Subjective:   Llewellyn Choplin today has no headache,no chest abdominal pain,no new weakness tingling or numbness, feels much better wants to go home today.   Objective:   Blood pressure 138/90, pulse (!) 101, temperature 97.7 F (36.5 C), temperature source Oral, resp. rate 19, height 6\' 3"  (1.905 m), weight 85.1 kg (187 lb 9.8 oz), SpO2 95 %.   Intake/Output Summary (Last 24 hours) at 09/28/2017 1128 Last data filed at 09/28/2017 0900 Gross per 24 hour  Intake 120 ml  Output -  Net 120 ml    Exam Awake Alert, Oriented x 3, No new F.N deficits, Normal affect [place].AT,PERRAL Supple Neck,No JVD, No cervical lymphadenopathy appriciated.  Symmetrical Chest wall movement, Good air movement bilaterally, CTAB RRR,No Gallops,Rubs or new Murmurs, No Parasternal Heave +ve B.Sounds, Abd Soft, Non tender, No organomegaly appriciated, No rebound -guarding or rigidity. No Cyanosis, Clubbing or edema, No new Rash or bruise  Data Review    CBC w Diff:  Lab Results  Component Value Date   WBC 14.2 (H) 09/27/2017   HGB 13.7 09/27/2017   HGB 13.7 10/25/2013   HCT 42.1 09/27/2017   HCT 40.9 10/25/2013   PLT 255 09/27/2017   PLT 224 10/25/2013   LYMPHOPCT 22.5 10/25/2013   MONOPCT 10.1 10/25/2013   EOSPCT 0.6 10/25/2013   BASOPCT 0.6 10/25/2013    CMP:  Lab Results  Component Value Date   NA 136 09/27/2017   NA 139 10/25/2013   K 4.2 09/27/2017   K 4.1 10/25/2013   CL 105 09/27/2017   CL 107 10/25/2013   CO2 23 09/27/2017   CO2 30 10/25/2013   BUN 10 09/27/2017   BUN 7 10/25/2013   CREATININE 0.78 09/27/2017   CREATININE 0.90 10/25/2013  PROT 7.3 09/26/2017   PROT 7.9 10/25/2013   ALBUMIN 3.7 09/26/2017   ALBUMIN 3.7 10/25/2013   BILITOT 0.4 09/26/2017   BILITOT 0.4 10/25/2013   ALKPHOS 57 09/26/2017   ALKPHOS 77 10/25/2013   AST 17 09/26/2017   AST 23 10/25/2013   ALT 22 09/26/2017   ALT 18 10/25/2013  .   Total Time in preparing paper work, data evaluation and todays exam - 35 minutes  Katha Hamming M.D on 09/28/2017 at 11:28 AM    Note: This dictation was prepared with Dragon dictation along with smaller phrase technology. Any transcriptional errors that result from this process are unintentional.

## 2017-09-28 NOTE — Progress Notes (Signed)
ADMISSION NOTE:  Pt admitted to room 148 from ICU , tele applied, Pt alert and oriented. Nasal canula 2 L. VSS. Bed in lowest position, call bell in reach and bed alarm on.

## 2017-09-28 NOTE — Progress Notes (Signed)
SATURATION QUALIFICATIONS: (This note is used to comply with regulatory documentation for home oxygen)  Patient Saturations on Room Air at Rest = 94%  Patient Saturations on Room Air while Ambulating = 90%  HR 130-144  Please briefly explain why patient needs home oxygen:

## 2017-09-28 NOTE — Care Management Note (Addendum)
Case Management Note  Patient Details  Name: Alexander Lindsey MRN: [id number] Date of Birth: Apr 21, 1971  Subjective/Objective:   Admitted to [facility] with the diagnosis of pneumonia. Lives with wife, Toma Aranmisha ([phone]) and 47 year old son. No primary care physician. Works at [employer] full time. No insurance. [place]. Takes care of all basic activities of daily living himself, drives. No services in the past. No falls. Good appetite.                 Action/Plan: Application for [facility] and Medication Management given. Discussed [facility] accepting new patients, income based. Will update lorrie Montez Moritaarter at [facility].   Expected Discharge Date:  09/28/17               Expected Discharge Plan:     In-House Referral:     Discharge planning Services     Post Acute Care Choice:    Choice offered to:     DME Arranged:    DME Agency:     HH Arranged:    HH Agency:     Status of Service:     If discussed at [employer] Meetings, dates discussed:    Additional Comments:  Gwenette GreetBrenda S Tyion Boylen, RN MSN CCM Care Management [phone] 09/28/2017, 11:26 AM

## 2017-09-28 NOTE — Progress Notes (Signed)
Discharge instructions reviewed with patient. Patient given prescriptions and notified of prescriptions sent electronically to [facility]. IV x 2 removed without complications. Patient awaiting transport at this time.

## 2017-09-29 ENCOUNTER — Inpatient Hospital Stay: Payer: Medicaid Other

## 2017-09-29 ENCOUNTER — Other Ambulatory Visit: Payer: Self-pay

## 2017-09-29 ENCOUNTER — Encounter: Payer: Self-pay | Admitting: Emergency Medicine

## 2017-09-29 ENCOUNTER — Emergency Department: Payer: Medicaid Other

## 2017-09-29 ENCOUNTER — Inpatient Hospital Stay
Admission: EM | Admit: 2017-09-29 | Discharge: 2017-10-03 | DRG: 193 | Disposition: A | Discharge status: Home / Self Care | Payer: Medicaid Other | Attending: Internal Medicine | Admitting: Internal Medicine

## 2017-09-29 DIAGNOSIS — Z8249 Family history of ischemic heart disease and other diseases of the circulatory system: Secondary | ICD-10-CM

## 2017-09-29 DIAGNOSIS — J918 Pleural effusion in other conditions classified elsewhere: Secondary | ICD-10-CM | POA: Diagnosis present

## 2017-09-29 DIAGNOSIS — Z79899 Other long term (current) drug therapy: Secondary | ICD-10-CM

## 2017-09-29 DIAGNOSIS — I10 Essential (primary) hypertension: Secondary | ICD-10-CM | POA: Diagnosis present

## 2017-09-29 DIAGNOSIS — J9600 Acute respiratory failure, unspecified whether with hypoxia or hypercapnia: Secondary | ICD-10-CM | POA: Diagnosis present

## 2017-09-29 DIAGNOSIS — I248 Other forms of acute ischemic heart disease: Secondary | ICD-10-CM | POA: Diagnosis present

## 2017-09-29 DIAGNOSIS — J189 Pneumonia, unspecified organism: Principal | ICD-10-CM | POA: Diagnosis present

## 2017-09-29 DIAGNOSIS — M549 Dorsalgia, unspecified: Secondary | ICD-10-CM | POA: Diagnosis not present

## 2017-09-29 DIAGNOSIS — F17210 Nicotine dependence, cigarettes, uncomplicated: Secondary | ICD-10-CM | POA: Diagnosis present

## 2017-09-29 DIAGNOSIS — R0602 Shortness of breath: Secondary | ICD-10-CM | POA: Diagnosis present

## 2017-09-29 DIAGNOSIS — Z9889 Other specified postprocedural states: Secondary | ICD-10-CM

## 2017-09-29 DIAGNOSIS — J9801 Acute bronchospasm: Secondary | ICD-10-CM | POA: Diagnosis present

## 2017-09-29 DIAGNOSIS — F1010 Alcohol abuse, uncomplicated: Secondary | ICD-10-CM | POA: Diagnosis present

## 2017-09-29 DIAGNOSIS — J90 Pleural effusion, not elsewhere classified: Secondary | ICD-10-CM

## 2017-09-29 DIAGNOSIS — J9601 Acute respiratory failure with hypoxia: Secondary | ICD-10-CM | POA: Diagnosis present

## 2017-09-29 DIAGNOSIS — R000 Tachycardia, unspecified: Secondary | ICD-10-CM | POA: Diagnosis present

## 2017-09-29 DIAGNOSIS — R0603 Acute respiratory distress: Secondary | ICD-10-CM

## 2017-09-29 LAB — COMPREHENSIVE METABOLIC PANEL
ALBUMIN: 3.7 g/dL (ref 3.5–5.0)
ALK PHOS: 63 U/L (ref 38–126)
ALT: 38 U/L (ref 17–63)
AST: 37 U/L (ref 15–41)
Anion gap: 11 (ref 5–15)
BUN: 15 mg/dL (ref 6–20)
CALCIUM: 9.2 mg/dL (ref 8.9–10.3)
CHLORIDE: 101 mmol/L (ref 101–111)
CO2: 23 mmol/L (ref 22–32)
CREATININE: 0.87 mg/dL (ref 0.61–1.24)
GFR calc Af Amer: >60 mL/min (ref >60)
GFR calc non Af Amer: >60 mL/min (ref >60)
GLUCOSE: 110 mg/dL — AB (ref 65–99)
Potassium: 4.2 mmol/L (ref 3.5–5.1)
SODIUM: 135 mmol/L (ref 135–145)
Total Bilirubin: 0.6 mg/dL (ref 0.3–1.2)
Total Protein: 8.3 g/dL — ABNORMAL HIGH (ref 6.5–8.1)

## 2017-09-29 LAB — GLUCOSE, PLEURAL OR PERITONEAL FLUID: Glucose, Fluid: 81 mg/dL

## 2017-09-29 LAB — CBC WITH DIFFERENTIAL/PLATELET
BASOS PCT: 1 %
Basophils Absolute: 0.1 10*3/uL (ref 0–0.1)
EOS ABS: 0.1 10*3/uL (ref 0–0.7)
Eosinophils Relative: 1 %
HEMATOCRIT: 46.1 % (ref 40.0–52.0)
HEMOGLOBIN: 14.9 g/dL (ref 13.0–18.0)
LYMPHS ABS: 1.1 10*3/uL (ref 1.0–3.6)
Lymphocytes Relative: 10 %
MCH: 28.3 pg (ref 26.0–34.0)
MCHC: 32.4 g/dL (ref 32.0–36.0)
MCV: 87.5 fL (ref 80.0–100.0)
MONO ABS: 1.2 10*3/uL — AB (ref 0.2–1.0)
MONOS PCT: 10 %
Neutro Abs: 9 10*3/uL — ABNORMAL HIGH (ref 1.4–6.5)
Neutrophils Relative %: 78 %
Platelets: 369 10*3/uL (ref 150–440)
RBC: 5.26 MIL/uL (ref 4.40–5.90)
RDW: 13.1 % (ref 11.5–14.5)
WBC: 11.4 10*3/uL — ABNORMAL HIGH (ref 3.8–10.6)

## 2017-09-29 LAB — PROTEIN, PLEURAL OR PERITONEAL FLUID: TOTAL PROTEIN, FLUID: 4.8 g/dL

## 2017-09-29 LAB — INFLUENZA PANEL BY PCR (TYPE A & B)
Influenza A By PCR: NEGATIVE
Influenza B By PCR: NEGATIVE

## 2017-09-29 LAB — TROPONIN I: Troponin I: 0.03 ng/mL (ref <0.03)

## 2017-09-29 LAB — LACTIC ACID, PLASMA: LACTIC ACID, VENOUS: 1 mmol/L (ref 0.5–1.9)

## 2017-09-29 LAB — LACTATE DEHYDROGENASE, PLEURAL OR PERITONEAL FLUID: LD FL: 1542 U/L — AB (ref 3–23)

## 2017-09-29 LAB — GLUCOSE, CAPILLARY: Glucose-Capillary: 105 mg/dL — ABNORMAL HIGH (ref 65–99)

## 2017-09-29 MED ORDER — IOPAMIDOL (ISOVUE-300) INJECTION 61%
75.0000 mL | Freq: Once | INTRAVENOUS | Status: AC | PRN
  Administered 2017-09-29: 75 mL via INTRAVENOUS

## 2017-09-29 MED ORDER — IPRATROPIUM-ALBUTEROL 0.5-2.5 (3) MG/3ML IN SOLN
3.0000 mL | Freq: Once | RESPIRATORY_TRACT | Status: AC
  Administered 2017-09-29: 3 mL via RESPIRATORY_TRACT
  Filled 2017-09-29: qty 3

## 2017-09-29 MED ORDER — METHYLPREDNISOLONE SODIUM SUCC 125 MG IJ SOLR
60.0000 mg | Freq: Four times a day (QID) | INTRAMUSCULAR | Status: DC
  Administered 2017-09-29 – 2017-10-02 (×13): 60 mg via INTRAVENOUS
  Filled 2017-09-29 (×12): qty 2

## 2017-09-29 MED ORDER — ACETAMINOPHEN 650 MG RE SUPP: 650.0000 mg | Freq: Four times a day (QID) | RECTAL | Status: DC | PRN

## 2017-09-29 MED ORDER — HYDROCODONE-ACETAMINOPHEN 5-325 MG PO TABS
1.0000 | ORAL_TABLET | ORAL | Status: DC | PRN
  Administered 2017-09-29: 2 via ORAL
  Administered 2017-09-30 – 2017-10-01 (×3): 1 via ORAL
  Filled 2017-09-29: qty 2
  Filled 2017-09-29 (×3): qty 1

## 2017-09-29 MED ORDER — GUAIFENESIN ER 600 MG PO TB12
600.0000 mg | ORAL_TABLET | Freq: Two times a day (BID) | ORAL | Status: DC
  Administered 2017-09-29 – 2017-10-03 (×8): 600 mg via ORAL
  Filled 2017-09-29 (×8): qty 1

## 2017-09-29 MED ORDER — ONDANSETRON HCL 4 MG PO TABS: 4.0000 mg | ORAL_TABLET | Freq: Four times a day (QID) | ORAL | Status: DC | PRN

## 2017-09-29 MED ORDER — VANCOMYCIN HCL IN DEXTROSE 1-5 GM/200ML-% IV SOLN
1000.0000 mg | Freq: Once | INTRAVENOUS | Status: AC
  Administered 2017-09-29: 1000 mg via INTRAVENOUS
  Filled 2017-09-29: qty 200

## 2017-09-29 MED ORDER — DEXTROSE 5 % IV SOLN
1.0000 g | Freq: Once | INTRAVENOUS | Status: AC
  Administered 2017-09-29: 1 g via INTRAVENOUS
  Filled 2017-09-29: qty 1

## 2017-09-29 MED ORDER — GUAIFENESIN-CODEINE 100-10 MG/5ML PO SOLN
10.0000 mL | ORAL | Status: DC | PRN
  Administered 2017-09-30: 10 mL via ORAL
  Filled 2017-09-29: qty 10

## 2017-09-29 MED ORDER — PIPERACILLIN-TAZOBACTAM 3.375 G IVPB
3.3750 g | Freq: Three times a day (TID) | INTRAVENOUS | Status: DC
  Administered 2017-09-29 – 2017-10-03 (×10): 3.375 g via INTRAVENOUS
  Filled 2017-09-29 (×11): qty 50

## 2017-09-29 MED ORDER — SODIUM CHLORIDE 0.9 % IV SOLN: INTRAVENOUS | Status: DC

## 2017-09-29 MED ORDER — IPRATROPIUM-ALBUTEROL 0.5-2.5 (3) MG/3ML IN SOLN
3.0000 mL | Freq: Once | RESPIRATORY_TRACT | Status: AC
Start: 2017-09-29 — End: 2017-09-29
  Administered 2017-09-29: 3 mL via RESPIRATORY_TRACT
  Filled 2017-09-29: qty 3

## 2017-09-29 MED ORDER — SODIUM CHLORIDE 0.9 % IV BOLUS (SEPSIS)
1000.0000 mL | Freq: Once | INTRAVENOUS | Status: AC
Start: 2017-09-29 — End: 2017-09-29
  Administered 2017-09-29: 1000 mL via INTRAVENOUS

## 2017-09-29 MED ORDER — METHYLPREDNISOLONE SODIUM SUCC 125 MG IJ SOLR
125.0000 mg | Freq: Once | INTRAMUSCULAR | Status: AC
  Administered 2017-09-29: 125 mg via INTRAVENOUS
  Filled 2017-09-29: qty 2

## 2017-09-29 MED ORDER — IPRATROPIUM-ALBUTEROL 0.5-2.5 (3) MG/3ML IN SOLN
3.0000 mL | RESPIRATORY_TRACT | Status: DC
  Administered 2017-09-29 – 2017-09-30 (×5): 3 mL via RESPIRATORY_TRACT
  Filled 2017-09-29 (×4): qty 3

## 2017-09-29 MED ORDER — BUDESONIDE 0.25 MG/2ML IN SUSP
0.2500 mg | Freq: Two times a day (BID) | RESPIRATORY_TRACT | Status: DC
  Administered 2017-09-30 – 2017-10-03 (×6): 0.25 mg via RESPIRATORY_TRACT
  Filled 2017-09-29 (×8): qty 2

## 2017-09-29 MED ORDER — ACETAMINOPHEN 325 MG PO TABS: 650.0000 mg | ORAL_TABLET | Freq: Four times a day (QID) | ORAL | Status: DC | PRN

## 2017-09-29 MED ORDER — ONDANSETRON HCL 4 MG/2ML IJ SOLN: 4.0000 mg | Freq: Four times a day (QID) | INTRAMUSCULAR | Status: DC | PRN

## 2017-09-29 MED ORDER — ENOXAPARIN SODIUM 40 MG/0.4ML ~~LOC~~ SOLN
40.0000 mg | SUBCUTANEOUS | Status: DC
  Administered 2017-09-29 – 2017-10-02 (×4): 40 mg via SUBCUTANEOUS
  Filled 2017-09-29 (×4): qty 0.4

## 2017-09-29 MED ORDER — VANCOMYCIN HCL 10 G IV SOLR
1250.0000 mg | Freq: Three times a day (TID) | INTRAVENOUS | Status: DC
  Administered 2017-09-29 – 2017-10-02 (×8): 1250 mg via INTRAVENOUS
  Filled 2017-09-29 (×10): qty 1250

## 2017-09-29 MED ORDER — NICOTINE 21 MG/24HR TD PT24
21.0000 mg | MEDICATED_PATCH | Freq: Every day | TRANSDERMAL | Status: DC
  Administered 2017-09-29 – 2017-10-03 (×5): 21 mg via TRANSDERMAL
  Filled 2017-09-29 (×5): qty 1

## 2017-09-29 NOTE — Progress Notes (Signed)
Pharmacy Antibiotic Note  Alexander Lindsey is a 47 y.o. male admitted on 09/29/2017 with pneumonia.  Pharmacy has been consulted for Vancomycin and Zosyn dosing.  Patient received one dose of Cefepime and Vancomycin 1000mg  in the ED.   Plan: Vancomycin 1250 IV every 8 hours.  Goal trough 15-20 mcg/mL. Zosyn 3.375g IV q8h (4 hour infusion). Vancomycin trough has been ordered for steady state. Pharmacy will continue to monitor and adjust as needed.    ke = 0.109 T1/2 = 6 hours Vd=60 Cmin= 16  Stack dose @ 6 hours   Height: 6\' 3"  (190.5 cm) Weight: 191 lb 5.8 oz (86.8 kg) IBW/kg (Calculated) : 84.5  Temp (24hrs), Avg:97.6 F (36.4 C), Min:97.4 F (36.3 C), Max:97.7 F (36.5 C)  Recent Labs  Lab 09/26/17 1524 09/26/17 1759 09/26/17 1930 09/27/17 0524 09/29/17 1216  WBC 15.7*  --   --  14.2* 11.4*  CREATININE 0.81  --   --  0.78 0.87  LATICACIDVEN  --  0.8 1.0  --  1.0    Estimated Creatinine Clearance: 126.8 mL/min (by C-G formula based on SCr of 0.87 mg/dL).    No Known Allergies  Antimicrobials this admission: Cefepime x1 Vancomycin 2/9 >> Zosyn 2/9 >>  Dose adjustments this admission:   Microbiology results: BCx: 2/9 >> sent  Sputum: 2/9 >> sent    Thank you for allowing pharmacy to be a part of this patient's care.  Yolanda BonineHannah Keoni Risinger, PharmD Pharmacy Resident 09/29/2017 4:02 PM

## 2017-09-29 NOTE — Progress Notes (Signed)
Pt bipap removed at MD request.  Placed on [place] with 3lpm.

## 2017-09-29 NOTE — Consult Note (Signed)
Reason for Consult: Respiratory failure Referring Physician: Dr. Autumn Patty is an 47 y.o. male.  HPI: Alexander Lindsey is a 47 year old gentleman with a past medical history remarkable for hypertension, alcohol abuse, tobacco use was recently hospitalized after presenting to the emergency department with acute respiratory failure on BiPAP. He was subsequent discharged home yesterday and 11 PM began having increasing shortness of breath prompting repeat visit to the emergency department. Patient was noted to have right-sided worsening infiltrate and effusion, secondary to increased work of breathing was started on noninvasive ventilation will be transferred to the intensive care unit. Pending CT scan of his chest  Past Medical History:  Diagnosis Date  . Alcohol abuse   . Hypertension   . Tobacco use     History reviewed. No pertinent surgical history.  Family History  Problem Relation Age of Onset  . CAD Father     Social History:  reports that he has been smoking cigarettes.  He has been smoking about 0.50 packs per day. he has never used smokeless tobacco. He reports that he drinks about 12.6 oz of alcohol per week. He reports that he does not use drugs.  Allergies: No Known Allergies  Medications: I have reviewed the patient's current medications.  Results for orders placed or performed during the hospital encounter of 09/29/17 (from the past 48 hour(s))  Lactic acid, plasma     Status: None   Collection Time: 09/29/17 12:16 PM  Result Value Ref Range   Lactic Acid, Venous 1.0 0.5 - 1.9 mmol/L    Comment: Performed at [facility], [address] [place], [place] [address]  Comprehensive metabolic panel     Status: Abnormal   Collection Time: 09/29/17 12:16 PM  Result Value Ref Range   Sodium 135 135 - 145 mmol/L   Potassium 4.2 3.5 - 5.1 mmol/L   Chloride 101 101 - 111 mmol/L   CO2 23 22 - 32 mmol/L   Glucose, Bld 110 (H) 65 - 99 mg/dL   BUN 15 6 - 20  mg/dL   Creatinine, Ser 0.87 0.61 - 1.24 mg/dL   Calcium 9.2 8.9 - 10.3 mg/dL   Total Protein 8.3 (H) 6.5 - 8.1 g/dL   Albumin 3.7 3.5 - 5.0 g/dL   AST 37 15 - 41 U/L   ALT 38 17 - 63 U/L   Alkaline Phosphatase 63 38 - 126 U/L   Total Bilirubin 0.6 0.3 - 1.2 mg/dL   GFR calc non Af Amer >60 >60 mL/min   GFR calc Af Amer >60 >60 mL/min    Comment: (NOTE) The eGFR has been calculated using the CKD EPI equation. This calculation has not been validated in all clinical situations. eGFR's persistently <60 mL/min signify possible Chronic Kidney Disease.    Anion gap 11 5 - 15    Comment: Performed at [facility], [address] [place], [place] [address]  CBC with Differential     Status: Abnormal   Collection Time: 09/29/17 12:16 PM  Result Value Ref Range   WBC 11.4 (H) 3.8 - 10.6 K/uL   RBC 5.26 4.40 - 5.90 MIL/uL   Hemoglobin 14.9 13.0 - 18.0 g/dL   HCT 46.1 40.0 - 52.0 %   MCV 87.5 80.0 - 100.0 fL   MCH 28.3 26.0 - 34.0 pg   MCHC 32.4 32.0 - 36.0 g/dL   RDW 13.1 11.5 - 14.5 %   Platelets 369 150 - 440 K/uL  Neutrophils Relative % 78 %   Neutro Abs 9.0 (H) 1.4 - 6.5 K/uL   Lymphocytes Relative 10 %   Lymphs Abs 1.1 1.0 - 3.6 K/uL   Monocytes Relative 10 %   Monocytes Absolute 1.2 (H) 0.2 - 1.0 K/uL   Eosinophils Relative 1 %   Eosinophils Absolute 0.1 0 - 0.7 K/uL   Basophils Relative 1 %   Basophils Absolute 0.1 0 - 0.1 K/uL    Comment: Performed at [facility], [address] [place], [place] [address]  Troponin I     Status: Abnormal   Collection Time: 09/29/17 12:16 PM  Result Value Ref Range   Troponin I 0.03 (HH) <0.03 ng/mL    Comment: CRITICAL RESULT CALLED TO, READ BACK BY AND VERIFIED WITH ANGELA ROBBINS 09/29/17 AT 1404 [facility] Performed at [facility], [address] [place], [place] [address]     Dg Chest 2 View  Result Date: 09/29/2017 CLINICAL DATA:  Acute shortness of breath. EXAM: CHEST  2 VIEW COMPARISON:  09/26/2017  radiographs and CT FINDINGS: Increasing moderate to large right pleural effusion noted. Associated right lower lung atelectasis/airspace disease again noted. The visualized cardiomediastinal silhouette is unchanged. There is no evidence of left pleural effusion. No pneumothorax identified. IMPRESSION: Increasing moderate to large right pleural effusion. Associated right lower lung atelectasis versus airspace disease noted. Electronically Signed   By: Margarette Canada M.D.   On: 09/29/2017 12:48   Ct Chest W Contrast  Result Date: 09/29/2017 CLINICAL DATA:  Shortness of breath and cough EXAM: CT CHEST WITH CONTRAST TECHNIQUE: Multidetector CT imaging of the chest was performed during intravenous contrast administration. CONTRAST:  19m ISOVUE-300 IOPAMIDOL (ISOVUE-300) INJECTION 61% COMPARISON:  Chest CT September 26, 2017; chest radiographs September 29, 2017 FINDINGS: Cardiovascular: There is no appreciable thoracic aortic aneurysm or dissection. The visualized great vessels appear unremarkable. Note that the right and left common carotid arteries arise as a common trunk, an anatomic variant. There is no appreciable pericardial effusion or pericardial thickening. There is no major vessel pulmonary embolus demonstrable. Mediastinum/Nodes: Thyroid appears unremarkable. There is a lymph node anterior to the carina measuring 1.4 x 1.2 cm. There is a subcarinal lymph node measuring 1.8 x 1.6 cm. These lymph nodes are slightly larger compared to recent study. No esophageal lesions are evident. Lungs/Pleura: There is a moderate partially loculated and partially free-flowing pleural effusion on the right, slightly larger than recent study. There is consolidation throughout most of the right lower lobe. There is also patchy consolidation in a portion of the right middle lobe with increased volume loss in the right middle lobe compared to recent study. There is new patchy airspace consolidation in the left lower lobe with  atelectatic change and possibly early pneumonia in the inferior lingula. There is a minimal left pleural effusion. There is a new focus of patchy infiltrate in the medial aspect of the anterior segment of the left upper lobe. There is atelectatic change in the right upper lobe, increased from recent CT. Upper Abdomen: Visualized upper abdominal structures appear normal. Musculoskeletal: There are no evident blastic or lytic bone lesions. IMPRESSION: 1. Multifocal pneumonia with consolidation greatest in the right middle and lower lobes with new infiltrate in the left base and anterior segment left upper lobe medially. Areas of atelectatic change have also increased. 2. Moderate pleural effusion on the right, partially loculated and partially free-flowing. Small left pleural effusion. 3. Prominent lymph nodes in the anterior peri carinal and subcarinal  regions, slightly larger than on recent CT. Suspect reactive etiology given the changes in the lung parenchyma. Electronically Signed   By: Lowella Grip III M.D.   On: 09/29/2017 14:34    ROS Limited review of systems at this time secondary to patient on noninvasive ventilation Blood pressure (!) 152/103, pulse (!) 112, temperature (!) 97.4 F (36.3 C), temperature source Oral, resp. rate 20, height _0  (1.905 m), weight 187 lb (84.8 kg), SpO2 95 %. Physical Exam  Vital signs: Please see the above listed vital signs HEENT: Limited oral exam secondary to noninvasive requirements, no jugular venous distention is appreciated, trachea is midline no thyromegaly noted Cardiovascular: Regular rate and rhythm Pulmonary: Diminished breath sounds on the right with basilar crackles noted mid right lung exam Abdominal: Positive bowel sounds, soft exam Extremity: cyanosis or edema noted neurologic: mouth no focal deficits noted  Assessment/Plan:  Respiratory distress . Will admit patient to the intensive care unit, continue on noninvasive ventilation, will  need thoracentesis, place on broad-spectrum anabiotic to include anaerobic coverage  Leukocytosis. Secondary to infection  Mild elevation in troponin. EKG reveals sinus tachycardia without any evidence of ischemia, most likely reflects supply demand ischemia  History of alcohol and tobacco abuse    Alexander Lindsey 09/29/2017, 2:49 PM

## 2017-09-29 NOTE — ED Triage Notes (Signed)
PT returns to ED after being discharged from hospital yesterday after treatment for pneumonia reporting increased SOB and worsening of symptoms. Pt tachypnic in triage with productive cough (yellow sputum). No fevers reported at home. No centralized rib pain reported but pt is verbalizing right rib and upper back pain.

## 2017-09-29 NOTE — ED Notes (Signed)
Attempted to call report, per ICU patient now going to a different room and the room is dirty. They cannot take report at this time.

## 2017-09-29 NOTE — H&P (Signed)
[employer] - [place] [facility]   PATIENT NAME: Alexander Lindsey    MR#:  [id number]  DATE OF BIRTH:  [birth year]  DATE OF ADMISSION:  09/29/2017  PRIMARY CARE PHYSICIAN: Patient, No Pcp Per   REQUESTING/REFERRING PHYSICIAN: Cecil CobbsVeronese, Caroline MD  CHIEF COMPLAINT:   Chief Complaint  Patient presents with  . Shortness of Breath    HISTORY OF PRESENT ILLNESS: Alexander Lindsey  is a 47 y.o. male with a known history of alcohol abuse, essential hypertension and nicotine abuse who was recently hospitalized on Wednesday with acute respiratory failure requiring BiPAP noted to have pneumonia.  He was discharged home yesterday.  He was ambulated and did not need oxygen.  When he got home he was feeling better however around 11 PM he started having worsening shortness of breath breathing continue to get worse therefore he comes to the emergency room.  In the ER he was noted to have a right-sided worsening infiltrate and effusion.  He had to be placed back on BiPAP.  Patient reports that he was not drinking any alcohol at home.  He did smoke cigarette.  He complains of productive cough of yellow sputum.  No fevers or chills.  PAST MEDICAL HISTORY:   Past Medical History:  Diagnosis Date  . Alcohol abuse   . Hypertension   . Tobacco use     PAST SURGICAL HISTORY: History reviewed. No pertinent surgical history.  SOCIAL HISTORY:  Social History   Tobacco Use  . Smoking status: Current Every Day Smoker    Packs/day: 0.50    Types: Cigarettes  . Smokeless tobacco: Never Used  Substance Use Topics  . Alcohol use: Yes    Alcohol/week: 12.6 oz    Types: 21 Cans of beer per week    FAMILY HISTORY:  Family History  Problem Relation Age of Onset  . CAD Father     DRUG ALLERGIES: No Known Allergies  REVIEW OF SYSTEMS:   CONSTITUTIONAL: No fever, fatigue or weakness.  EYES: No blurred or double vision.  EARS, NOSE, AND THROAT: No tinnitus or ear pain.  RESPIRATORY:  Positive cough, positive shortness of breath, positive wheezing or no hemoptysis.  CARDIOVASCULAR: No chest pain, orthopnea, edema.  GASTROINTESTINAL: No nausea, vomiting, diarrhea or abdominal pain.  GENITOURINARY: No dysuria, hematuria.  ENDOCRINE: No polyuria, nocturia,  HEMATOLOGY: No anemia, easy bruising or bleeding SKIN: No rash or lesion. MUSCULOSKELETAL: No joint pain or arthritis.   NEUROLOGIC: No tingling, numbness, weakness.  PSYCHIATRY: No anxiety or depression.   MEDICATIONS AT HOME:  Prior to Admission medications   Medication Sig Start Date End Date Taking? Authorizing Provider  albuterol (PROVENTIL HFA;VENTOLIN HFA) 108 (90 Base) MCG/ACT inhaler Inhale 2 puffs into the lungs every 6 (six) hours as needed for wheezing or shortness of breath. 09/28/17  Yes Katha HammingKonidena, Snehalatha, MD  amoxicillin-clavulanate (AUGMENTIN) 875-125 MG tablet Take 1 tablet by mouth 2 (two) times daily for 14 days. 09/28/17 10/12/17 Yes Katha HammingKonidena, Snehalatha, MD  azithromycin (ZITHROMAX Z-PAK) 250 MG tablet Take 2 tablets (500 mg) on  Day 1,  followed by 1 tablet (250 mg) once daily on Days 2 through 5. 09/28/17 10/03/17 Yes Katha HammingKonidena, Snehalatha, MD  LORazepam (ATIVAN) 0.5 MG tablet Take 1 tablet (0.5 mg total) by mouth every 8 (eight) hours. 09/28/17  Yes Katha HammingKonidena, Snehalatha, MD  predniSONE (STERAPRED UNI-PAK 21 TAB) 10 MG (21) TBPK tablet Taper by 10 mg po daily 09/28/17  Yes Katha HammingKonidena, Snehalatha, MD  chlorhexidine (PERIDEX) 0.12 %  solution Use as directed 15 mLs in the mouth or throat 2 (two) times daily. Patient not taking: Reported on 09/26/2017 03/25/15   Sharman Cheek, MD  lidocaine (XYLOCAINE) 2 % solution Use as directed 20 mLs in the mouth or throat every 2 (two) hours as needed for mouth pain. Gargle and spit out Patient not taking: Reported on 09/26/2017 03/25/15   Sharman Cheek, MD      PHYSICAL EXAMINATION:   VITAL SIGNS: Blood pressure 104/90, pulse (!) 122, temperature (!) 97.4 F (36.3 C),  temperature source Oral, resp. rate (!) 30, height 6\' 3"  (1.905 m), weight 187 lb (84.8 kg), SpO2 93 %.  GENERAL:  47 y.o.-year-old patient lying in the bed with accessory muscle usage EYES: Pupils equal, round, reactive to light and accommodation. No scleral icterus. Extraocular muscles intact.  HEENT: Head atraumatic, normocephalic. Oropharynx and nasopharynx clear.  NECK:  Supple, no jugular venous distention. No thyroid enlargement, no tenderness.  LUNGS: Positive accessory muscle usage, bilateral wheezing throughout both lung CARDIOVASCULAR: S1, S2 normal. No murmurs, rubs, or gallops.  ABDOMEN: Soft, nontender, nondistended. Bowel sounds present. No organomegaly or mass.  EXTREMITIES: No pedal edema, cyanosis, or clubbing.  NEUROLOGIC: Cranial nerves II through XII are intact. Muscle strength 5/5 in all extremities. Sensation intact. Gait not checked.  PSYCHIATRIC: The patient is alert and oriented x 3.  SKIN: No obvious rash, lesion, or ulcer.   LABORATORY PANEL:   CBC Recent Labs  Lab 09/26/17 1524 09/27/17 0524 09/29/17 1216  WBC 15.7* 14.2* 11.4*  HGB 14.8 13.7 14.9  HCT 45.7 42.1 46.1  PLT 314 255 369  MCV 87.9 88.2 87.5  MCH 28.5 28.7 28.3  MCHC 32.4 32.5 32.4  RDW 13.4 13.1 13.1  LYMPHSABS  --   --  1.1  MONOABS  --   --  1.2*  EOSABS  --   --  0.1  BASOSABS  --   --  0.1   ------------------------------------------------------------------------------------------------------------------  Chemistries  Recent Labs  Lab 09/26/17 1524 09/26/17 2015 09/27/17 0524 09/29/17 1216  NA 137  --  136 135  K 4.3  --  4.2 4.2  CL 104  --  105 101  CO2 24  --  23 23  GLUCOSE 119*  --  150* 110*  BUN 12  --  10 15  CREATININE 0.81  --  0.78 0.87  CALCIUM 9.4  --  8.6* 9.2  AST  --  17  --  37  ALT  --  22  --  38  ALKPHOS  --  57  --  63  BILITOT  --  0.4  --  0.6    ------------------------------------------------------------------------------------------------------------------ estimated creatinine clearance is 126.8 mL/min (by C-G formula based on SCr of 0.87 mg/dL). ------------------------------------------------------------------------------------------------------------------ No results for input(s): TSH, T4TOTAL, T3FREE, THYROIDAB in the last 72 hours.  Invalid input(s): FREET3   Coagulation profile No results for input(s): INR, PROTIME in the last 168 hours. ------------------------------------------------------------------------------------------------------------------- No results for input(s): DDIMER in the last 72 hours. -------------------------------------------------------------------------------------------------------------------  Cardiac Enzymes Recent Labs  Lab 09/26/17 1524  TROPONINI <0.03   ------------------------------------------------------------------------------------------------------------------ Invalid input(s): POCBNP  ---------------------------------------------------------------------------------------------------------------  Urinalysis No results found for: COLORURINE, APPEARANCEUR, LABSPEC, PHURINE, GLUCOSEU, HGBUR, BILIRUBINUR, KETONESUR, PROTEINUR, UROBILINOGEN, NITRITE, LEUKOCYTESUR   RADIOLOGY: Dg Chest 2 View  Result Date: 09/29/2017 CLINICAL DATA:  Acute shortness of breath. EXAM: CHEST  2 VIEW COMPARISON:  09/26/2017 radiographs and CT FINDINGS: Increasing moderate to large right pleural effusion noted. Associated right lower lung  atelectasis/airspace disease again noted. The visualized cardiomediastinal silhouette is unchanged. There is no evidence of left pleural effusion. No pneumothorax identified. IMPRESSION: Increasing moderate to large right pleural effusion. Associated right lower lung atelectasis versus airspace disease noted. Electronically Signed   By: Harmon Pier M.D.   On: 09/29/2017  12:48    EKG: Orders placed or performed during the hospital encounter of 09/29/17  . ED EKG  . ED EKG  . EKG 12-Lead  . EKG 12-Lead    IMPRESSION AND PLAN: Patient is a 47 year old with acute respiratory failure  1.  Acute respiratory failure due to worsening pneumonia possible para pneumonic effusion I will obtain a CT scan of the chest Continue BiPAP We will treat him with vancomycin and Zosyn I have discussed with the intensivist for patient to be going to the stepdown unit I will place him on steroids and nebulizer therapy due to bronchospasm  2.  Essential hypertension blood pressure currently stable use as needed medications as needed  3.  History of alcohol abuse this would be day 5 without evidence of any withdrawal Continue to monitor I will hold off on CIWA protocol  4.  Nicotine abuse: Smoking cessation provided 4 minutes spent strongly recommend he stop smoking nicotine patch will be started      All the records are reviewed and case discussed with ED provider. Management plans discussed with the patient, family and they are in agreement.  CODE STATUS: Code Status History    Date Active Date Inactive Code Status Order ID Comments User Context   09/26/2017 17:57 09/28/2017 18:39 Full Code [id number]  Milagros Loll, MD ED       TOTAL TIME TAKING CARE OF THIS PATIENT: 55 minutes critical care time spent  Auburn Bilberry M.D on 09/29/2017 at 1:43 PM  Between 7am to 6pm - Pager - [phone]  After 6pm go to [email or web address] - password [email or web address]  [facility]  Office  [phone]  CC: Primary care physician; Patient, No Pcp Per

## 2017-09-29 NOTE — Progress Notes (Signed)
Patient is doing much better s/p thoracentesis. His SPO2 is normal on 3L [place]. Patient is stable for transfer out of the ICU. Repeat Thora scheduled for tomorrow with IR  Magdalene S. [facility] ANP-BC Pulmonary and Critical Care Medicine [facility] Pager [phone] or [phone]  NB: This document was prepared using Dragon voice recognition software and may include unintentional dictation errors.

## 2017-09-29 NOTE — Progress Notes (Signed)
Patient ID: Damien Fusintonio L Mondo, male   DOB: 1971-05-17, 47 y.o.   MRN: [id number] Pulmonary/critical care attending  Procedure note Thoracentesis Consent is obtained after discussion with patient.  Reviewed  procedure with patient who wishes to have a repeated thoracentesis performed Ultrasound guidance utilized large pocket of fluid identified right posterior 6 7 intercostal space Sterile technique performed Ultrasound utilized to mark site Topical Hibiclens Subdermal lidocaine given Finder needle injected anesthetize deeper structures to include rib and pleura Upon withdrawal was able to aspirate pleural fluid Catheter was inserted however unable to aspirate out of this pocket.  Moved more medially and then second site was cleaned and anesthetized in the same fashion Catheter was placed in pleural fluid was removed.  500 cc of fluid was removed before no additional fluid would come out Fluid was serosanguineous.  Nonpurulent, not foul-smelling Most likely inflammatory parapneumonic effusion on the gross appearance We will obtain chest x-ray Pleural fluid for protein, glucose, LDH, pH, cytology, culture If patient still symptomatic tomorrow we will schedule interventional radiology guided therapeutic drainage  Tora KindredJohn Yarelis Ambrosino, DO

## 2017-09-29 NOTE — ED Provider Notes (Signed)
[facility] Emergency Department Provider Note  ____________________________________________  Time seen: Approximately 12:40 PM  I have reviewed the triage vital signs and the nursing notes.   HISTORY  Chief Complaint Shortness of Breath   HPI Keyston L Lada is a 47 y.o. male with a history of smoking, alcohol abuse, and hypertension who presents for evaluation of shortness of breath.patient was discharged from the hospital yesterday after being admitted for 2 days for acute respiratory failure requiring BiPAP in the setting of community-acquired pneumonia with pleural effusion. He reports that yesterday evening he started to feel worse again. Has had progressively worsening shortness of breath which is now constant and severe, has had chills at home. He continues to complain of right-sided sharp chest pain that is pleuritic, constant, moderate in intensity. No fever. He continues to cough. He endorses compliance with antibiotics.  Past Medical History:  Diagnosis Date  . Alcohol abuse   . Hypertension   . Tobacco use     Patient Active Problem List   Diagnosis Date Noted  . Acute respiratory failure (HCC) 09/29/2017  . Pneumonia 09/26/2017    History reviewed. No pertinent surgical history.  Prior to Admission medications   Medication Sig Start Date End Date Taking? Authorizing Provider  albuterol (PROVENTIL HFA;VENTOLIN HFA) 108 (90 Base) MCG/ACT inhaler Inhale 2 puffs into the lungs every 6 (six) hours as needed for wheezing or shortness of breath. 09/28/17  Yes Katha Hamming, MD  amoxicillin-clavulanate (AUGMENTIN) 875-125 MG tablet Take 1 tablet by mouth 2 (two) times daily for 14 days. 09/28/17 10/12/17 Yes Katha Hamming, MD  azithromycin (ZITHROMAX Z-PAK) 250 MG tablet Take 2 tablets (500 mg) on  Day 1,  followed by 1 tablet (250 mg) once daily on Days 2 through 5. 09/28/17 10/03/17 Yes Katha Hamming, MD  LORazepam (ATIVAN) 0.5  MG tablet Take 1 tablet (0.5 mg total) by mouth every 8 (eight) hours. 09/28/17  Yes Katha Hamming, MD  predniSONE (STERAPRED UNI-PAK 21 TAB) 10 MG (21) TBPK tablet Taper by 10 mg po daily 09/28/17  Yes Katha Hamming, MD  chlorhexidine (PERIDEX) 0.12 % solution Use as directed 15 mLs in the mouth or throat 2 (two) times daily. Patient not taking: Reported on 09/26/2017 03/25/15   Sharman Cheek, MD  lidocaine (XYLOCAINE) 2 % solution Use as directed 20 mLs in the mouth or throat every 2 (two) hours as needed for mouth pain. Gargle and spit out Patient not taking: Reported on 09/26/2017 03/25/15   Sharman Cheek, MD    Allergies Patient has no known allergies.  Family History  Problem Relation Age of Onset  . CAD Father     Social History Social History   Tobacco Use  . Smoking status: Current Every Day Smoker    Packs/day: 0.50    Types: Cigarettes  . Smokeless tobacco: Never Used  Substance Use Topics  . Alcohol use: Yes    Alcohol/week: 12.6 oz    Types: 21 Cans of beer per week  . Drug use: No    Review of Systems  Constitutional: + fever. Eyes: Negative for visual changes. ENT: Negative for sore throat. Neck: No neck pain  Cardiovascular: + chest pain. Respiratory: + shortness of breath, cough Gastrointestinal: Negative for abdominal pain, vomiting or diarrhea. Genitourinary: Negative for dysuria. Musculoskeletal: Negative for back pain. Skin: Negative for rash. Neurological: Negative for headaches, weakness or numbness. Psych: No SI or HI  ____________________________________________   PHYSICAL EXAM:  VITAL SIGNS: ED Triage  Vitals  Enc Vitals Group     BP 09/29/17 1214 104/90     Pulse Rate 09/29/17 1214 (!) 122     Resp 09/29/17 1214 (!) 30     Temp 09/29/17 1214 (!) 97.4 F (36.3 C)     Temp Source 09/29/17 1214 Oral     SpO2 09/29/17 1214 93 %     Weight 09/29/17 1215 187 lb (84.8 kg)     Height 09/29/17 1215 6\' 3"  (1.905 m)     Head  Circumference --      Peak Flow --      Pain Score 09/29/17 1215 6     Pain Loc --      Pain Edu? --      Excl. in GC? --     Constitutional: Alert and oriented, moderate respiratory distress.  HEENT:      Head: Normocephalic and atraumatic.         Eyes: Conjunctivae are normal. Sclera is non-icteric.       Mouth/Throat: Mucous membranes are moist.       Neck: Supple with no signs of meningismus. Cardiovascular: tachycardic with regular rhythm. No murmurs, gallops, or rubs. 2+ symmetrical distal pulses are present in all extremities. No JVD. Respiratory: moderate respiratory distress, tachypnea, satting 91% on room air, severely diminished air movement on the right Gastrointestinal: Soft, non tender, and non distended with positive bowel sounds. No rebound or guarding. Musculoskeletal: Nontender with normal range of motion in all extremities. No edema, cyanosis, or erythema of extremities. Neurologic: Normal speech and language. Face is symmetric. Moving all extremities. No gross focal neurologic deficits are appreciated. Skin: Skin is warm, dry and intact. No rash noted. Psych: appropriate mood and affect  ____________________________________________   LABS (all labs ordered are listed, but only abnormal results are displayed)  Labs Reviewed  COMPREHENSIVE METABOLIC PANEL - Abnormal; Notable for the following components:      Result Value   Glucose, Bld 110 (*)    Total Protein 8.3 (*)    All other components within normal limits  CBC WITH DIFFERENTIAL/PLATELET - Abnormal; Notable for the following components:   WBC 11.4 (*)    Neutro Abs 9.0 (*)    Monocytes Absolute 1.2 (*)    All other components within normal limits  TROPONIN I - Abnormal; Notable for the following components:   Troponin I 0.03 (*)    All other components within normal limits  GLUCOSE, CAPILLARY - Abnormal; Notable for the following components:   Glucose-Capillary 105 (*)    All other components  within normal limits  LACTATE DEHYDROGENASE, PLEURAL OR PERITONEAL FLUID - Abnormal; Notable for the following components:   LD, Fluid 1,542 (*)    All other components within normal limits  CULTURE, BLOOD (ROUTINE X 2)  CULTURE, BLOOD (ROUTINE X 2)  CULTURE, EXPECTORATED SPUTUM-ASSESSMENT  BODY FLUID CULTURE  LACTIC ACID, PLASMA  INFLUENZA PANEL BY PCR (TYPE A & B)  PROTEIN, PLEURAL OR PERITONEAL FLUID  GLUCOSE, PLEURAL OR PERITONEAL FLUID  PH, BODY FLUID  CBC  BASIC METABOLIC PANEL  MAGNESIUM  PHOSPHORUS  CYTOLOGY - NON PAP   ____________________________________________  EKG  ED ECG REPORT I, Nita Sickle, the attending physician, personally viewed and interpreted this ECG.  Sinus tachycardia, rate of 122, normal intervals, normal axis, no ST elevations or depressions. unchanged from prior ____________________________________________  RADIOLOGY  Interpreted by me: CXR: Worsening R pleural effusion    Interpretation by Radiologist:  Dg Chest 2  View  Result Date: 09/29/2017 CLINICAL DATA:  Acute shortness of breath. EXAM: CHEST  2 VIEW COMPARISON:  09/26/2017 radiographs and CT FINDINGS: Increasing moderate to large right pleural effusion noted. Associated right lower lung atelectasis/airspace disease again noted. The visualized cardiomediastinal silhouette is unchanged. There is no evidence of left pleural effusion. No pneumothorax identified. IMPRESSION: Increasing moderate to large right pleural effusion. Associated right lower lung atelectasis versus airspace disease noted. Electronically Signed   By: Harmon Pier M.D.   On: 09/29/2017 12:48   Ct Chest W Contrast  Result Date: 09/29/2017 CLINICAL DATA:  Shortness of breath and cough EXAM: CT CHEST WITH CONTRAST TECHNIQUE: Multidetector CT imaging of the chest was performed during intravenous contrast administration. CONTRAST:  75mL ISOVUE-300 IOPAMIDOL (ISOVUE-300) INJECTION 61% COMPARISON:  Chest CT September 26, 2017;  chest radiographs September 29, 2017 FINDINGS: Cardiovascular: There is no appreciable thoracic aortic aneurysm or dissection. The visualized great vessels appear unremarkable. Note that the right and left common carotid arteries arise as a common trunk, an anatomic variant. There is no appreciable pericardial effusion or pericardial thickening. There is no major vessel pulmonary embolus demonstrable. Mediastinum/Nodes: Thyroid appears unremarkable. There is a lymph node anterior to the carina measuring 1.4 x 1.2 cm. There is a subcarinal lymph node measuring 1.8 x 1.6 cm. These lymph nodes are slightly larger compared to recent study. No esophageal lesions are evident. Lungs/Pleura: There is a moderate partially loculated and partially free-flowing pleural effusion on the right, slightly larger than recent study. There is consolidation throughout most of the right lower lobe. There is also patchy consolidation in a portion of the right middle lobe with increased volume loss in the right middle lobe compared to recent study. There is new patchy airspace consolidation in the left lower lobe with atelectatic change and possibly early pneumonia in the inferior lingula. There is a minimal left pleural effusion. There is a new focus of patchy infiltrate in the medial aspect of the anterior segment of the left upper lobe. There is atelectatic change in the right upper lobe, increased from recent CT. Upper Abdomen: Visualized upper abdominal structures appear normal. Musculoskeletal: There are no evident blastic or lytic bone lesions. IMPRESSION: 1. Multifocal pneumonia with consolidation greatest in the right middle and lower lobes with new infiltrate in the left base and anterior segment left upper lobe medially. Areas of atelectatic change have also increased. 2. Moderate pleural effusion on the right, partially loculated and partially free-flowing. Small left pleural effusion. 3. Prominent lymph nodes in the anterior  peri carinal and subcarinal regions, slightly larger than on recent CT. Suspect reactive etiology given the changes in the lung parenchyma. Electronically Signed   By: Bretta Bang III M.D.   On: 09/29/2017 14:34   Dg Chest Port 1 View  Result Date: 09/29/2017 CLINICAL DATA:  Post RIGHT thoracentesis EXAM: PORTABLE CHEST 1 VIEW COMPARISON:  Portable exam 1707 hours compared to early CT and radiographic exams of 09/29/2017 FINDINGS: Enlargement of cardiac silhouette. Mediastinal contours and pulmonary vascularity normal. Persistent moderate-sized RIGHT pleural effusion and basilar atelectasis. No definite pneumothorax. Minimal LEFT basilar atelectasis remains. Upper lungs clear. External artifacts project over chest. IMPRESSION: Persistent RIGHT pleural effusion and basilar atelectasis. No pneumothorax. Electronically Signed   By: Ulyses Southward M.D.   On: 09/29/2017 17:24      ____________________________________________   PROCEDURES  Procedure(s) performed: None Procedures Critical Care performed: yes  CRITICAL CARE Performed by: Nita Sickle  ?  Total critical care time:  40 min  Critical care time was exclusive of separately billable procedures and treating other patients.  Critical care was necessary to treat or prevent imminent or life-threatening deterioration.  Critical care was time spent personally by me on the following activities: development of treatment plan with patient and/or surrogate as well as nursing, discussions with consultants, evaluation of patient's response to treatment, examination of patient, obtaining history from patient or surrogate, ordering and performing treatments and interventions, ordering and review of laboratory studies, ordering and review of radiographic studies, pulse oximetry and re-evaluation of patient's condition.  ____________________________________________   INITIAL IMPRESSION / ASSESSMENT AND PLAN / ED COURSE  47 y.o. male with  a history of smoking, alcohol abuse, and hypertension who presents for evaluation of worsening shortness of breath and R sided chest pain since being dc from the hospital yesterday for PNA and R sided pleural effusion. Patient had negative CTA of the chest 3 days ago when he initially presented to the ED. CXR showing worsening R sided PNA and pleural effusion. Patient with moderate increased work of breathing, hypoxic to low 90s on room air and decreased air movement on the right consistent with x-ray findings. Patient was started on BiPAP, DuoNeb and Solu-Medrol due to history of smoking, IV fluids, broad-spectrum antibiotics including cefepime and vancomycin. We'll send a flu swab. At this time I don't believe patient warrants repeat CT scan as this seems to be progression of the same illness. Anticipate admission.      As part of my medical decision making, I reviewed the following data within the electronic MEDICAL RECORD NUMBER Nursing notes reviewed and incorporated, Labs reviewed , EKG interpreted , Old chart reviewed, Radiograph reviewed , Discussed with admitting physician , Notes from prior ED visits and [place] Controlled Substance Database    Pertinent labs & imaging results that were available during my care of the patient were reviewed by me and considered in my medical decision making (see chart for details).    ____________________________________________   FINAL CLINICAL IMPRESSION(S) / ED DIAGNOSES  Final diagnoses:  Acute respiratory failure with hypoxia (HCC)  Worsening pneumonia      NEW MEDICATIONS STARTED DURING THIS VISIT:  ED Discharge Orders    None       Note:  This document was prepared using Dragon voice recognition software and may include unintentional dictation errors.    Don PerkingVeronese, [place], MD 09/29/17 2033

## 2017-09-30 ENCOUNTER — Other Ambulatory Visit: Payer: Self-pay

## 2017-09-30 LAB — CBC
HEMATOCRIT: 42.8 % (ref 40.0–52.0)
Hemoglobin: 13.8 g/dL (ref 13.0–18.0)
MCH: 28.3 pg (ref 26.0–34.0)
MCHC: 32.4 g/dL (ref 32.0–36.0)
MCV: 87.6 fL (ref 80.0–100.0)
PLATELETS: 339 10*3/uL (ref 150–440)
RBC: 4.89 MIL/uL (ref 4.40–5.90)
RDW: 13.2 % (ref 11.5–14.5)
WBC: 16 10*3/uL — ABNORMAL HIGH (ref 3.8–10.6)

## 2017-09-30 LAB — PHOSPHORUS: PHOSPHORUS: 3.3 mg/dL (ref 2.5–4.6)

## 2017-09-30 LAB — BASIC METABOLIC PANEL
Anion gap: 9 (ref 5–15)
BUN: 14 mg/dL (ref 6–20)
CALCIUM: 8.9 mg/dL (ref 8.9–10.3)
CO2: 23 mmol/L (ref 22–32)
Chloride: 104 mmol/L (ref 101–111)
Creatinine, Ser: 0.85 mg/dL (ref 0.61–1.24)
GFR calc Af Amer: >60 mL/min (ref >60)
GLUCOSE: 141 mg/dL — AB (ref 65–99)
Potassium: 4.5 mmol/L (ref 3.5–5.1)
Sodium: 136 mmol/L (ref 135–145)

## 2017-09-30 LAB — VANCOMYCIN, TROUGH: VANCOMYCIN TR: 15 ug/mL (ref 15–20)

## 2017-09-30 LAB — MAGNESIUM: MAGNESIUM: 2.3 mg/dL (ref 1.7–2.4)

## 2017-09-30 MED ORDER — ADULT MULTIVITAMIN W/MINERALS CH
1.0000 | ORAL_TABLET | Freq: Every day | ORAL | Status: DC
  Administered 2017-10-01 – 2017-10-03 (×3): 1 via ORAL
  Filled 2017-09-30 (×3): qty 1

## 2017-09-30 MED ORDER — VITAMIN B-1 100 MG PO TABS
100.0000 mg | ORAL_TABLET | Freq: Every day | ORAL | Status: DC
  Administered 2017-10-01 – 2017-10-03 (×3): 100 mg via ORAL
  Filled 2017-09-30 (×3): qty 1

## 2017-09-30 MED ORDER — LORAZEPAM 1 MG PO TABS: 1.0000 mg | ORAL_TABLET | Freq: Four times a day (QID) | ORAL | Status: AC | PRN

## 2017-09-30 MED ORDER — IPRATROPIUM-ALBUTEROL 0.5-2.5 (3) MG/3ML IN SOLN
3.0000 mL | Freq: Four times a day (QID) | RESPIRATORY_TRACT | Status: DC
  Administered 2017-09-30 – 2017-10-02 (×9): 3 mL via RESPIRATORY_TRACT
  Filled 2017-09-30 (×10): qty 3

## 2017-09-30 MED ORDER — LORAZEPAM 2 MG/ML IJ SOLN: 0.0000 mg | Freq: Four times a day (QID) | INTRAMUSCULAR | Status: AC

## 2017-09-30 MED ORDER — LORAZEPAM 2 MG/ML IJ SOLN: 1.0000 mg | Freq: Four times a day (QID) | INTRAMUSCULAR | Status: AC | PRN

## 2017-09-30 MED ORDER — LORAZEPAM 2 MG/ML IJ SOLN: 0.0000 mg | Freq: Two times a day (BID) | INTRAMUSCULAR | Status: DC

## 2017-09-30 MED ORDER — FOLIC ACID 1 MG PO TABS
1.0000 mg | ORAL_TABLET | Freq: Every day | ORAL | Status: DC
  Administered 2017-10-01 – 2017-10-03 (×3): 1 mg via ORAL
  Filled 2017-09-30 (×3): qty 1

## 2017-09-30 MED ORDER — THIAMINE HCL 100 MG/ML IJ SOLN
100.0000 mg | Freq: Every day | INTRAMUSCULAR | Status: DC
  Filled 2017-09-30: qty 2

## 2017-09-30 NOTE — Progress Notes (Signed)
[facility] [place] [facility]   PATIENT NAME: Alexander Lindsey    MR#:  [id number]  DATE OF BIRTH:  30-May-1971  SUBJECTIVE:  CHIEF COMPLAINT: Patient is resting comfortably.  Denies any shortness of breath.  Had right-sided thoracentesis yesterday  REVIEW OF SYSTEMS:  CONSTITUTIONAL: No fever, fatigue or weakness.  EYES: No blurred or double vision.  EARS, NOSE, AND THROAT: No tinnitus or ear pain.  RESPIRATORY: No cough, shortness of breath, wheezing or hemoptysis.  CARDIOVASCULAR: No chest pain, orthopnea, edema.  GASTROINTESTINAL: No nausea, vomiting, diarrhea or abdominal pain.  GENITOURINARY: No dysuria, hematuria.  ENDOCRINE: No polyuria, nocturia,  HEMATOLOGY: No anemia, easy bruising or bleeding SKIN: No rash or lesion. MUSCULOSKELETAL: No joint pain or arthritis.   NEUROLOGIC: No tingling, numbness, weakness.  PSYCHIATRY: No anxiety or depression.   DRUG ALLERGIES:  No Known Allergies  VITALS:  Blood pressure 123/84, pulse 89, temperature 97.8 F (36.6 C), temperature source Oral, resp. rate 18, height 6\' 3"  (1.905 m), weight 86.8 kg (191 lb 5.8 oz), SpO2 97 %.  PHYSICAL EXAMINATION:  GENERAL:  47 y.o.-year-old patient lying in the bed with no acute distress.  EYES: Pupils equal, round, reactive to light and accommodation. No scleral icterus. Extraocular muscles intact.  HEENT: Head atraumatic, normocephalic. Oropharynx and nasopharynx clear.  NECK:  Supple, no jugular venous distention. No thyroid enlargement, no tenderness.  LUNGS:  diminished breath sounds on the right side , no wheezing, rales,rhonchi or crepitation. No use of accessory muscles of respiration.  Right-sided thoracentesis site is healing well no leakage of the fluid CARDIOVASCULAR: S1, S2 normal. No murmurs, rubs, or gallops.  ABDOMEN: Soft, nontender, nondistended. Bowel sounds present. No organomegaly or mass.  EXTREMITIES: No pedal edema, cyanosis, or clubbing.   NEUROLOGIC: Cranial nerves II through XII are intact. Muscle strength at patient's baseline in all extremities. Sensation intact. Gait not checked.  PSYCHIATRIC: The patient is alert and oriented x 3.  SKIN: No obvious rash, lesion, or ulcer.    LABORATORY PANEL:   CBC Recent Labs  Lab 09/30/17 0417  WBC 16.0*  HGB 13.8  HCT 42.8  PLT 339   ------------------------------------------------------------------------------------------------------------------  Chemistries  Recent Labs  Lab 09/29/17 1216 09/30/17 0417  NA 135 136  K 4.2 4.5  CL 101 104  CO2 23 23  GLUCOSE 110* 141*  BUN 15 14  CREATININE 0.87 0.85  CALCIUM 9.2 8.9  MG  --  2.3  AST 37  --   ALT 38  --   ALKPHOS 63  --   BILITOT 0.6  --    ------------------------------------------------------------------------------------------------------------------  Cardiac Enzymes Recent Labs  Lab 09/29/17 1216  TROPONINI 0.03*   ------------------------------------------------------------------------------------------------------------------  RADIOLOGY:  Dg Chest 2 View  Result Date: 09/29/2017 CLINICAL DATA:  Acute shortness of breath. EXAM: CHEST  2 VIEW COMPARISON:  09/26/2017 radiographs and CT FINDINGS: Increasing moderate to large right pleural effusion noted. Associated right lower lung atelectasis/airspace disease again noted. The visualized cardiomediastinal silhouette is unchanged. There is no evidence of left pleural effusion. No pneumothorax identified. IMPRESSION: Increasing moderate to large right pleural effusion. Associated right lower lung atelectasis versus airspace disease noted. Electronically Signed   By: Harmon PierJeffrey  Hu M.D.   On: 09/29/2017 12:48   Ct Chest W Contrast  Result Date: 09/29/2017 CLINICAL DATA:  Shortness of breath and cough EXAM: CT CHEST WITH CONTRAST TECHNIQUE: Multidetector CT imaging of the chest was performed during intravenous contrast administration. CONTRAST:  75mL  ISOVUE-300  IOPAMIDOL (ISOVUE-300) INJECTION 61% COMPARISON:  Chest CT September 26, 2017; chest radiographs September 29, 2017 FINDINGS: Cardiovascular: There is no appreciable thoracic aortic aneurysm or dissection. The visualized great vessels appear unremarkable. Note that the right and left common carotid arteries arise as a common trunk, an anatomic variant. There is no appreciable pericardial effusion or pericardial thickening. There is no major vessel pulmonary embolus demonstrable. Mediastinum/Nodes: Thyroid appears unremarkable. There is a lymph node anterior to the carina measuring 1.4 x 1.2 cm. There is a subcarinal lymph node measuring 1.8 x 1.6 cm. These lymph nodes are slightly larger compared to recent study. No esophageal lesions are evident. Lungs/Pleura: There is a moderate partially loculated and partially free-flowing pleural effusion on the right, slightly larger than recent study. There is consolidation throughout most of the right lower lobe. There is also patchy consolidation in a portion of the right middle lobe with increased volume loss in the right middle lobe compared to recent study. There is new patchy airspace consolidation in the left lower lobe with atelectatic change and possibly early pneumonia in the inferior lingula. There is a minimal left pleural effusion. There is a new focus of patchy infiltrate in the medial aspect of the anterior segment of the left upper lobe. There is atelectatic change in the right upper lobe, increased from recent CT. Upper Abdomen: Visualized upper abdominal structures appear normal. Musculoskeletal: There are no evident blastic or lytic bone lesions. IMPRESSION: 1. Multifocal pneumonia with consolidation greatest in the right middle and lower lobes with new infiltrate in the left base and anterior segment left upper lobe medially. Areas of atelectatic change have also increased. 2. Moderate pleural effusion on the right, partially loculated and partially  free-flowing. Small left pleural effusion. 3. Prominent lymph nodes in the anterior peri carinal and subcarinal regions, slightly larger than on recent CT. Suspect reactive etiology given the changes in the lung parenchyma. Electronically Signed   By: Bretta Bang III M.D.   On: 09/29/2017 14:34   Dg Chest Port 1 View  Result Date: 09/29/2017 CLINICAL DATA:  Post RIGHT thoracentesis EXAM: PORTABLE CHEST 1 VIEW COMPARISON:  Portable exam 1707 hours compared to early CT and radiographic exams of 09/29/2017 FINDINGS: Enlargement of cardiac silhouette. Mediastinal contours and pulmonary vascularity normal. Persistent moderate-sized RIGHT pleural effusion and basilar atelectasis. No definite pneumothorax. Minimal LEFT basilar atelectasis remains. Upper lungs clear. External artifacts project over chest. IMPRESSION: Persistent RIGHT pleural effusion and basilar atelectasis. No pneumothorax. Electronically Signed   By: Ulyses Southward M.D.   On: 09/29/2017 17:24    EKG:   Orders placed or performed during the hospital encounter of 09/29/17  . ED EKG  . ED EKG  . EKG 12-Lead  . EKG 12-Lead    ASSESSMENT AND PLAN:   Patient is a 47 year old with acute respiratory failure  1.  Acute respiratory failure due to worsening pneumonia possible para pneumonic effusion Status post right-sided thoracentesis and tapped app 500 cc , patient still has loculated fluid on the right side scheduled for ultrasound-guided thoracentesis tomorrow by interventional radiology.  off BiPAP Continue to treat him with vancomycin and Zosyn I have discussed with the intensivist dr.Conforti on steroids and nebulizer therapy due to bronchospasm   2.  Essential hypertension blood pressure currently stable use as needed medications as needed  3.  History of alcohol abuse this would be day 5 without evidence of any withdrawal Continue to monitor I will hold off on CIWA protocol  4.  Nicotine abuse: Smoking cessation  provided 4 minutes spent strongly recommend he stop smoking nicotine patch will be started       All the records are reviewed and case discussed with Care Management/Social Workerr. Management plans discussed with the patient, family and they are in agreement.  CODE STATUS: FC   TOTAL TIME TAKING CARE OF THIS PATIENT: 36  minutes.   POSSIBLE D/C IN  DAYS, DEPENDING ON CLINICAL CONDITION.  Note: This dictation was prepared with Dragon dictation along with smaller phrase technology. Any transcriptional errors that result from this process are unintentional.   Ramonita Lab M.D on 09/30/2017 at 2:55 PM  Between 7am to 6pm - Pager - [phone] After 6pm go to [email or web address] - password [email or web address]  [facility]  Office  [phone]  CC: Primary care physician; Patient, No Pcp Per

## 2017-09-30 NOTE — Progress Notes (Signed)
Patient spo2 in 70% -80% on 6 L [place].  Called resp therapy to come see. AC anne advised. Will cont to monitor

## 2017-09-30 NOTE — Progress Notes (Signed)
Pharmacy Antibiotic Note  Alexander Lindsey is a 47 y.o. male admitted on 09/29/2017 with pneumonia.  Pharmacy has been consulted for Vancomycin and Zosyn dosing.  Patient received one dose of Cefepime and Vancomycin 1000mg  in the ED.   Patient recently in hospital 09/26/17 and discharged 09/28/17, returning the same night  Plan: Vancomycin 1250 IV every 8 hours.  Goal trough 15-20 mcg/mL. Zosyn 3.375g IV q8h (4 hour infusion). Vancomycin trough has been ordered for steady state. Pharmacy will continue to monitor and adjust as needed.  ke = 0.109 T1/2 = 6 hours Vd=60 Cmin= 16  Stack dose @ 6 hours  2/10 :  Vancomycin trough= 15 mcg/ml @ 1108. Will continue current regimen. Patient is s/p thoracentesis. F/u for next Vancomycin trough in 3-5 days unless renal fxn or status changes.     Height: 6\' 3"  (190.5 cm) Weight: 191 lb 5.8 oz (86.8 kg) IBW/kg (Calculated) : 84.5  Temp (24hrs), Avg:97.8 F (36.6 C), Min:97.6 F (36.4 C), Max:98.4 F (36.9 C)  Recent Labs  Lab 09/26/17 1524 09/26/17 1759 09/26/17 1930 09/27/17 0524 09/29/17 1216 09/30/17 0417 09/30/17 1108  WBC 15.7*  --   --  14.2* 11.4* 16.0*  --   CREATININE 0.81  --   --  0.78 0.87 0.85  --   LATICACIDVEN  --  0.8 1.0  --  1.0  --   --   VANCOTROUGH  --   --   --   --   --   --  15    Estimated Creatinine Clearance: 129.8 mL/min (by C-G formula based on SCr of 0.85 mg/dL).    No Known Allergies  Antimicrobials this admission: Cefepime x1 Vancomycin 2/9 >> Zosyn 2/9 >>  Dose adjustments this admission:   Microbiology results: BCx: 2/9 >> sent  Sputum: 2/9 >> sent    Thank you for allowing pharmacy to be a part of this patient's care.  Bari MantisKristin Glenyce Randle PharmD Clinical Pharmacist 09/30/2017

## 2017-09-30 NOTE — Progress Notes (Signed)
Patient in formed RN that he drink at least a 12pk of beers everyday. Patient reports his last drink was about 2 night ago. MD notified and new orders received for CIWA.

## 2017-10-01 ENCOUNTER — Inpatient Hospital Stay: Payer: Medicaid Other

## 2017-10-01 ENCOUNTER — Telehealth: Payer: Self-pay | Admitting: Pharmacy Technician

## 2017-10-01 LAB — CBC
HCT: 37.6 % — ABNORMAL LOW (ref 40.0–52.0)
HEMOGLOBIN: 12.2 g/dL — AB (ref 13.0–18.0)
MCH: 28.8 pg (ref 26.0–34.0)
MCHC: 32.4 g/dL (ref 32.0–36.0)
MCV: 88.7 fL (ref 80.0–100.0)
PLATELETS: 370 10*3/uL (ref 150–440)
RBC: 4.24 MIL/uL — AB (ref 4.40–5.90)
RDW: 13.3 % (ref 11.5–14.5)
WBC: 18.2 10*3/uL — ABNORMAL HIGH (ref 3.8–10.6)

## 2017-10-01 LAB — BASIC METABOLIC PANEL
Anion gap: 9 (ref 5–15)
BUN: 21 mg/dL — AB (ref 6–20)
CHLORIDE: 105 mmol/L (ref 101–111)
CO2: 23 mmol/L (ref 22–32)
CREATININE: 0.85 mg/dL (ref 0.61–1.24)
Calcium: 8.6 mg/dL — ABNORMAL LOW (ref 8.9–10.3)
GFR calc Af Amer: >60 mL/min (ref >60)
Glucose, Bld: 139 mg/dL — ABNORMAL HIGH (ref 65–99)
Potassium: 4.5 mmol/L (ref 3.5–5.1)
SODIUM: 137 mmol/L (ref 135–145)

## 2017-10-01 LAB — CULTURE, BLOOD (ROUTINE X 2)
Culture: NO GROWTH
Culture: NO GROWTH
SPECIAL REQUESTS: ADEQUATE
Special Requests: ADEQUATE

## 2017-10-01 NOTE — Progress Notes (Signed)
Bipap refused 

## 2017-10-01 NOTE — Progress Notes (Signed)
[facility] - [place] at [facility]   PATIENT NAME: Alexander Lindsey    MR#:  [id number]  DATE OF BIRTH:  [birth year]  SUBJECTIVE:  CHIEF COMPLAINT: Patient is resting comfortably.  Denies any shortness of breath.  Feels much better, patient had right-sided thoracentesis 09/29/17 Interventional radiologist could not thoracentesis as there is not enough fluid today  REVIEW OF SYSTEMS:  CONSTITUTIONAL: No fever, fatigue or weakness.  EYES: No blurred or double vision.  EARS, NOSE, AND THROAT: No tinnitus or ear pain.  RESPIRATORY: No cough, shortness of breath, wheezing or hemoptysis.  CARDIOVASCULAR: No chest pain, orthopnea, edema.  GASTROINTESTINAL: No nausea, vomiting, diarrhea or abdominal pain.  GENITOURINARY: No dysuria, hematuria.  ENDOCRINE: No polyuria, nocturia,  HEMATOLOGY: No anemia, easy bruising or bleeding SKIN: No rash or lesion. MUSCULOSKELETAL: No joint pain or arthritis.   NEUROLOGIC: No tingling, numbness, weakness.  PSYCHIATRY: No anxiety or depression.   DRUG ALLERGIES:  No Known Allergies  VITALS:  Blood pressure 124/88, pulse 89, temperature 98.2 F (36.8 C), temperature source Oral, resp. rate 18, height 6\' 3"  (1.905 m), weight 86.8 kg (191 lb 5.8 oz), SpO2 97 %.  PHYSICAL EXAMINATION:  GENERAL:  47 y.o.-year-old patient lying in the bed with no acute distress.  EYES: Pupils equal, round, reactive to light and accommodation. No scleral icterus. Extraocular muscles intact.  HEENT: Head atraumatic, normocephalic. Oropharynx and nasopharynx clear.  NECK:  Supple, no jugular venous distention. No thyroid enlargement, no tenderness.  LUNGS:  diminished breath sounds on the right side , no wheezing, rales,rhonchi or crepitation. No use of accessory muscles of respiration.  Right-sided thoracentesis site is healing well no leakage of the fluid CARDIOVASCULAR: S1, S2 normal. No murmurs, rubs, or gallops.  ABDOMEN: Soft, nontender,  nondistended. Bowel sounds present. No organomegaly or mass.  EXTREMITIES: No pedal edema, cyanosis, or clubbing.  NEUROLOGIC: Cranial nerves II through XII are intact. Muscle strength at patient's baseline in all extremities. Sensation intact. Gait not checked.  PSYCHIATRIC: The patient is alert and oriented x 3.  SKIN: No obvious rash, lesion, or ulcer.    LABORATORY PANEL:   CBC Recent Labs  Lab 10/01/17 0405  WBC 18.2*  HGB 12.2*  HCT 37.6*  PLT 370   ------------------------------------------------------------------------------------------------------------------  Chemistries  Recent Labs  Lab 09/29/17 1216 09/30/17 0417 10/01/17 0405  NA 135 136 137  K 4.2 4.5 4.5  CL 101 104 105  CO2 23 23 23   GLUCOSE 110* 141* 139*  BUN 15 14 21*  CREATININE 0.87 0.85 0.85  CALCIUM 9.2 8.9 8.6*  MG  --  2.3  --   AST 37  --   --   ALT 38  --   --   ALKPHOS 63  --   --   BILITOT 0.6  --   --    ------------------------------------------------------------------------------------------------------------------  Cardiac Enzymes Recent Labs  Lab 09/29/17 1216  TROPONINI 0.03*   ------------------------------------------------------------------------------------------------------------------  RADIOLOGY:  [place] Chest (pleural Effusion)  Result Date: 10/01/2017 CLINICAL DATA:  Right pleural effusion, assess for thoracentesis EXAM: CHEST ULTRASOUND COMPARISON:  09/29/2017 chest x-ray FINDINGS: Small right effusion with dense left lower lobe collapse/consolidation. There is not enough pleural fluid to warrant therapeutic thoracentesis. Procedure not performed. IMPRESSION: Small right pleural effusion.  See above comment. Electronically Signed   By: Judie Petit.  Shick M.D.   On: 10/01/2017 09:25    EKG:   Orders placed or performed during the hospital encounter of 09/29/17  . ED EKG  .  ED EKG  . EKG 12-Lead  . EKG 12-Lead    ASSESSMENT AND PLAN:   Patient is a 47 year old with acute  respiratory failure  1.  Acute respiratory failure due to worsening pneumonia possible para pneumonic effusion -Status post right-sided thoracentesis and tapped app 500 cc ,  -ultrasound-guided thoracentesis  by interventional radiology scheduled today but could not perform thoracentesis as patient did not have enough fluid.  off BiPAP Continue to treat him with vancomycin and Zosyn Appreciate pulmonology follow-up on steroids and nebulizer therapy due to bronchospasm   2.  Essential hypertension blood pressure currently stable use as needed medications as needed  3.  History of alcohol abuse this would be day 5 without evidence of any withdrawal Continue to monitor I will hold off on CIWA protocol  4.  Nicotine abuse: Smoking cessation provided 4 minutes spent strongly recommend he stop smoking nicotine patch will be started       All the records are reviewed and case discussed with Care Management/Social Workerr. Management plans discussed with the patient, family and they are in agreement.  CODE STATUS: FC   TOTAL TIME TAKING CARE OF THIS PATIENT: 36  minutes.   POSSIBLE D/C IN 1-2 DAYS, DEPENDING ON CLINICAL CONDITION.  Note: This dictation was prepared with Dragon dictation along with smaller phrase technology. Any transcriptional errors that result from this process are unintentional.   Ramonita LabAruna Reneka Nebergall M.D on 10/01/2017 at 8:59 PM  Between 7am to 6pm - Pager - [phone] After 6pm go to [email or web address] - password [email or web address]  Fabio Neighborsagle [place] [facility]  Office  [phone]  CC: Primary care physician; Patient, No Pcp Per

## 2017-10-01 NOTE — Care Management (Signed)
Met with patient after noting that there is no health insurance. He would appreciate assistance with medication and has received referral in past to [facility]. New application provided to patient.

## 2017-10-01 NOTE — Progress Notes (Signed)
Minimal fluid noted on US.  Suspect most of what's seen on x-ray is lung consolidation and atelectasis vs true effusion.  Risks benefit ration discussed with patient and no procedure was pursued.  Letha CapeKelly E Bao Bazen 9:03 AM 10/01/2017

## 2017-10-01 NOTE — Telephone Encounter (Signed)
Contacted patient.  Mailed new patient packet to patient.  Alexander DacostaBetty J. Lindsey Alexander Lindsey Care Manager [facility]

## 2017-10-02 LAB — PH, BODY FLUID: PH, BODY FLUID: 6.6

## 2017-10-02 LAB — VANCOMYCIN, RANDOM: Vancomycin Rm: 8

## 2017-10-02 LAB — CREATININE, SERUM: Creatinine, Ser: 0.92 mg/dL (ref 0.61–1.24)

## 2017-10-02 MED ORDER — METHOCARBAMOL 500 MG PO TABS
500.0000 mg | ORAL_TABLET | Freq: Three times a day (TID) | ORAL | Status: DC | PRN
  Administered 2017-10-02: 500 mg via ORAL
  Filled 2017-10-02: qty 1

## 2017-10-02 MED ORDER — VANCOMYCIN HCL 10 G IV SOLR
1750.0000 mg | Freq: Two times a day (BID) | INTRAVENOUS | Status: DC
  Administered 2017-10-02: 1750 mg via INTRAVENOUS
  Filled 2017-10-02: qty 1750
  Filled 2017-10-02: qty 1000

## 2017-10-02 MED ORDER — METHYLPREDNISOLONE SODIUM SUCC 40 MG IJ SOLR
40.0000 mg | Freq: Two times a day (BID) | INTRAMUSCULAR | Status: DC
  Administered 2017-10-03 (×2): 40 mg via INTRAVENOUS
  Filled 2017-10-02 (×2): qty 1

## 2017-10-02 MED ORDER — IPRATROPIUM-ALBUTEROL 0.5-2.5 (3) MG/3ML IN SOLN
3.0000 mL | Freq: Three times a day (TID) | RESPIRATORY_TRACT | Status: DC
  Administered 2017-10-03: 3 mL via RESPIRATORY_TRACT
  Filled 2017-10-02: qty 3

## 2017-10-02 NOTE — Progress Notes (Signed)
[facility] - [place] at [facility]   PATIENT NAME: Alexander Lindsey    MR#:  [id number]  DATE OF BIRTH:  February 24, 1971  SUBJECTIVE:  CHIEF COMPLAINT: Patient is uncomfortable with left-sided back pain which is new, denies any shortness of breath patient had right-sided thoracentesis 09/29/17 Interventional radiologist could not thoracentesis as there is not enough fluid today  REVIEW OF SYSTEMS:  CONSTITUTIONAL: No fever, fatigue or weakness.  EYES: No blurred or double vision.  EARS, NOSE, AND THROAT: No tinnitus or ear pain.  RESPIRATORY: No cough, shortness of breath, wheezing or hemoptysis.  CARDIOVASCULAR: No chest pain, orthopnea, edema.  GASTROINTESTINAL: No nausea, vomiting, diarrhea or abdominal pain.  GENITOURINARY: No dysuria, hematuria.  ENDOCRINE: No polyuria, nocturia,  HEMATOLOGY: No anemia, easy bruising or bleeding SKIN: No rash or lesion. MUSCULOSKELETAL: No joint pain or arthritis.   NEUROLOGIC: No tingling, numbness, weakness.  PSYCHIATRY: No anxiety or depression.   DRUG ALLERGIES:  No Known Allergies  VITALS:  Blood pressure (!) 136/92, pulse 69, temperature 97.8 F (36.6 C), temperature source Oral, resp. rate 18, height 6\' 3"  (1.905 m), weight 86.8 kg (191 lb 5.8 oz), SpO2 96 %.  PHYSICAL EXAMINATION:  GENERAL:  47 y.o.-year-old patient lying in the bed with no acute distress.  EYES: Pupils equal, round, reactive to light and accommodation. No scleral icterus. Extraocular muscles intact.  HEENT: Head atraumatic, normocephalic. Oropharynx and nasopharynx clear.  NECK:  Supple, no jugular venous distention. No thyroid enlargement, no tenderness.  LUNGS: Moderate breath sounds on the right side , no wheezing, rales,rhonchi or crepitation. No use of accessory muscles of respiration.  Right-sided thoracentesis site is healing well no leakage of the fluid CARDIOVASCULAR: S1, S2 normal. No murmurs, rubs, or gallops.  ABDOMEN: Soft,  nontender, nondistended. Bowel sounds present. No organomegaly or mass.  No flank tenderness EXTREMITIES: No pedal edema, cyanosis, or clubbing.  NEUROLOGIC: Cranial nerves II through XII are intact. Muscle strength at patient's baseline in all extremities. Sensation intact. Gait not checked.  PSYCHIATRIC: The patient is alert and oriented x 3.  SKIN: No obvious rash, lesion, or ulcer.    LABORATORY PANEL:   CBC Recent Labs  Lab 10/01/17 0405  WBC 18.2*  HGB 12.2*  HCT 37.6*  PLT 370   ------------------------------------------------------------------------------------------------------------------  Chemistries  Recent Labs  Lab 09/29/17 1216 09/30/17 0417 10/01/17 0405 10/02/17 1449  NA 135 136 137  --   K 4.2 4.5 4.5  --   CL 101 104 105  --   CO2 23 23 23   --   GLUCOSE 110* 141* 139*  --   BUN 15 14 21*  --   CREATININE 0.87 0.85 0.85 0.92  CALCIUM 9.2 8.9 8.6*  --   MG  --  2.3  --   --   AST 37  --   --   --   ALT 38  --   --   --   ALKPHOS 63  --   --   --   BILITOT 0.6  --   --   --    ------------------------------------------------------------------------------------------------------------------  Cardiac Enzymes Recent Labs  Lab 09/29/17 1216  TROPONINI 0.03*   ------------------------------------------------------------------------------------------------------------------  RADIOLOGY:  [place] Chest (pleural Effusion)  Result Date: 10/01/2017 CLINICAL DATA:  Right pleural effusion, assess for thoracentesis EXAM: CHEST ULTRASOUND COMPARISON:  09/29/2017 chest x-ray FINDINGS: Small right effusion with dense left lower lobe collapse/consolidation. There is not enough pleural fluid to warrant therapeutic thoracentesis. Procedure not performed.  IMPRESSION: Small right pleural effusion.  See above comment. Electronically Signed   By: Judie PetitM.  Shick M.D.   On: 10/01/2017 09:25    EKG:   Orders placed or performed during the hospital encounter of 09/29/17  . ED  EKG  . ED EKG  . EKG 12-Lead  . EKG 12-Lead    ASSESSMENT AND PLAN:   Patient is a 47 year old with acute respiratory failure  1.  Acute respiratory failure due to worsening pneumonia possible para pneumonic effusion -Status post right-sided thoracentesis and tapped app 500 cc ,  -ultrasound-guided thoracentesis  by interventional radiology scheduled today but could not perform thoracentesis as patient did not have enough fluid.  off BiPAP Continue to treat him with vancomycin and Zosyn Appreciate pulmonology follow-up on steroids and nebulizer therapy due to bronchospasm Incentive spirometry  2.  Essential hypertension blood pressure currently stable use as needed medications as needed  3.  History of alcohol abuse this would be day 5 without evidence of any withdrawal Continue to monitor I will hold off on CIWA protocol  4.    Back pain will give muscle relaxants PT evaluation  Nicotine abuse: Smoking cessation provided 4 minutes spent strongly recommend he stop smoking nicotine patch will be started       All the records are reviewed and case discussed with Care Management/Social Workerr. Management plans discussed with the patient, family and they are in agreement.  CODE STATUS: FC   TOTAL TIME TAKING CARE OF THIS PATIENT: 36  minutes.   POSSIBLE D/C IN 1 DAYS, DEPENDING ON CLINICAL CONDITION.  Note: This dictation was prepared with Dragon dictation along with smaller phrase technology. Any transcriptional errors that result from this process are unintentional.   Ramonita LabAruna Hanalei Glace M.D on 10/02/2017 at 3:53 PM  Between 7am to 6pm - Pager - [phone] After 6pm go to [email or web address] - password [email or web address]  Fabio Neighborsagle [place] [facility]  Office  [phone]  CC: Primary care physician; Patient, No Pcp Per

## 2017-10-02 NOTE — Plan of Care (Signed)
  Education: Knowledge of General Education information will improve 10/02/2017 1327 - Progressing by Tomie ChinaJackson, Scarleth Brame Cecelie, RN

## 2017-10-02 NOTE — Progress Notes (Signed)
Physical Therapy Evaluation Patient Details Name: Alexander Lindsey MRN: [id number] DOB: 06-20-1971 Today's Date: 10/02/2017   History of Present Illness  Alexander Lindsey  is a 47 y.o. male with a known history of alcohol abuse, essential hypertension and nicotine abuse who was recently hospitalized on Wednesday of last week with acute respiratory failure requiring BiPAP noted to have pneumonia.  He was discharged home.  He was ambulated and did not need oxygen. When he got home he was feeling better however around 11 PM he started having worsening shortness of breath breathing continue to get worse therefore he comes to the emergency room.  In the ER he was noted to have a right-sided worsening infiltrate and effusion.  He had to be placed back on BiPAP.  Patient reports that he was not drinking any alcohol at home.  He did smoke cigarette.  He complains of productive cough of yellow sputum.  No fevers or chills. He is now admitted for acute respiratory failure secondary to worseneing PNA and effusion. Pt is s/p thoracentesis removing 500mL. Another thoracentesis was attempted today but was unable to be performed because there was no fluid to be removed.   Clinical Impression  Pt is independent with all bed mobility, transfers, and ambulation. No deficits identified. He is able to complete a full lap around RN station. SaO2 94% on room air and only drops to 93% with exertion. Denies DOE. HR in the 70's bpm. Pt able to perform head turns and gait speed changes without LOB. Pt complains of bilateral mid/low back pain over the last 2 weeks since he first started getting sick. No prior history of low back injury or pain. Pt reports to right lower posterior ribcage near ribs 10-12. Resolved at this time. No easing factors. Only aggravating factors are taking a deep inspiration and coughing. Unable to reproduce pain with palpation. Pt demonstrates pain free spinal flexion, extension, lateral flexion, and  rotation. Denies bowel/bladder changes. No radiating/radicular pain. Denies bilateral LE N/T. Pain is not over site of thoracent No history of kidney stones. No frequent or burning urination. Pain appears like it could be related to intercostal muscle pain from coughing secondary to PNA. RN notified that he could benefit from kpad if ordered by MD. Otherwise pt has no further PT needs during his admission. Will complete order. Please enter new order if status or needs change.       Follow Up Recommendations No PT follow up    Equipment Recommendations  None recommended by PT    Recommendations for Other Services       Precautions / Restrictions Precautions Precautions: None Restrictions Weight Bearing Restrictions: No      Mobility  Bed Mobility Overal bed mobility: Independent             General bed mobility comments: No deficits  Transfers Overall transfer level: Independent Equipment used: None             General transfer comment: Good speed, sequencing, and stability. No deficits  Ambulation/Gait Ambulation/Gait assistance: Independent Ambulation Distance (Feet): 200 Feet Assistive device: None   Gait velocity: WFL Gait velocity interpretation: >2.62 ft/sec, indicative of independent community ambulator General Gait Details: No deficits identified. Pt able to complete a full lap around RN station. SaO2 94% on room air and only drops to 93% with exertion. Denies DOE. HR in the 70's bpm. Pt able to perform head turns and gait speed changes without LOB  Stairs  Wheelchair Mobility    Modified Rankin (Stroke Patients Only)       Balance Overall balance assessment: Independent(Single leg balance approximately 5 seconds)                                           Pertinent Vitals/Pain Pain Assessment: 0-10 Pain Score: 3  Pain Location: R low back Pain Descriptors / Indicators: Aching(Pt reports that his L low back  was hurting earlier but not i) Pain Intervention(s): Monitored during session    Home Living Family/patient expects to be discharged to:: Private residence Living Arrangements: Alone   Type of Home: House Home Access: Stairs to enter Entrance Stairs-Rails: Can reach both Entrance Stairs-Number of Steps: 3 Home Layout: One level Home Equipment: None      Prior Function Level of Independence: Independent         Comments: Full community ambulator without assistive device. No falls. Doesn't drive. Independent with ADLs/IADLs. Works at [employer]   Dominant Hand: Right    Extremity/Trunk Assessment   Upper Extremity Assessment Upper Extremity Assessment: Overall WFL for tasks assessed    Lower Extremity Assessment Lower Extremity Assessment: Overall WFL for tasks assessed       Communication   Communication: No difficulties  Cognition Arousal/Alertness: Awake/alert Behavior During Therapy: WFL for tasks assessed/performed Overall Cognitive Status: Within Functional Limits for tasks assessed                                        General Comments      Exercises     Assessment/Plan    PT Assessment Patent does not need any further PT services  PT Problem List Pain       PT Treatment Interventions      PT Goals (Current goals can be found in the Care Plan section)  Acute Rehab PT Goals PT Goal Formulation: All assessment and education complete, DC therapy    Frequency     Barriers to discharge        Co-evaluation               AM-PAC PT "6 Clicks" Daily Activity  Outcome Measure Difficulty turning over in bed (including adjusting bedclothes, sheets and blankets)?: None Difficulty moving from lying on back to sitting on the side of the bed? : None Difficulty sitting down on and standing up from a chair with arms (e.g., wheelchair, bedside commode, etc,.)?: None Help needed moving to and from a bed to chair  (including a wheelchair)?: None Help needed walking in hospital room?: None Help needed climbing 3-5 steps with a railing? : None 6 Click Score: 24    End of Session Equipment Utilized During Treatment: Gait belt Activity Tolerance: Patient tolerated treatment well Patient left: in bed;with call bell/phone within reach Nurse Communication: Mobility status PT Visit Diagnosis: Pain Pain - Right/Left: Right Pain - part of body: (Back)    Time: 2130-8657 PT Time Calculation (min) (ACUTE ONLY): 13 min   Charges:   PT Evaluation $PT Eval Low Complexity: 1 Low     PT G Codes:        Sharalyn Ink Zakaria Sedor PT, DPT    Oshua Mcconaha 10/02/2017, 5:24 PM

## 2017-10-02 NOTE — Progress Notes (Addendum)
Pharmacy Antibiotic Note  Alexander Lindsey is a 47 y.o. male admitted on 09/29/2017 with pneumonia.  Pharmacy has been consulted for Vancomycin and Zosyn dosing.  Patient received one dose of Cefepime and Vancomycin 1000mg  in the ED.   Patient recently in hospital 09/26/17 and discharged 09/28/17, returning the same night  Patient has pneumonia with effusion with recent antibiotic exposure and is s/p thoracentesis.   Plan: Continue Zosyn 3.375 g EI q 8 hours.   Plan to transition vancomycin to q 12 h dosing in an attempt to avoid accumulation with trough currently therapeutic on 1250 mg q 8 hours. Will check a random vancomycin level along with SCr prior to transitioning to 1750 mg q 12 hours.   Random vancomycin level= 8 mcg/ml Will continue with plans to transition patient to vancomycin 1750 mg iv q 12 hours. Trough scheduled with the 4th dose.   Height: 6\' 3"  (190.5 cm) Weight: 191 lb 5.8 oz (86.8 kg) IBW/kg (Calculated) : 84.5  Temp (24hrs), Avg:98 F (36.7 C), Min:97.8 F (36.6 C), Max:98.2 F (36.8 C)  Recent Labs  Lab 09/26/17 1524 09/26/17 1759 09/26/17 1930 09/27/17 0524 09/29/17 1216 09/30/17 0417 09/30/17 1108 10/01/17 0405  WBC 15.7*  --   --  14.2* 11.4* 16.0*  --  18.2*  CREATININE 0.81  --   --  0.78 0.87 0.85  --  0.85  LATICACIDVEN  --  0.8 1.0  --  1.0  --   --   --   VANCOTROUGH  --   --   --   --   --   --  15  --     Estimated Creatinine Clearance: 129.8 mL/min (by C-G formula based on SCr of 0.85 mg/dL).    No Known Allergies  Antimicrobials this admission: Cefepime x1 Vancomycin 2/9 >> Zosyn 2/9 >>  Dose adjustments this admission: Vancomycin 1250mg  q 8h >> 1750mg  q 12h  Microbiology results: BCx: 2/9 >> NGTD Sputum: 2/9 >> cancelled Pleural fluid 2/9 >> NGTD   Thank you for allowing pharmacy to be a part of this patient's care.  Luisa HartScott Shavette Shoaff, PharmD Clinical Pharmacist  10/02/2017

## 2017-10-03 LAB — BODY FLUID CULTURE: Culture: NO GROWTH

## 2017-10-03 MED ORDER — GUAIFENESIN ER 600 MG PO TB12: 600.0000 mg | ORAL_TABLET | Freq: Two times a day (BID) | ORAL | 0 refills | Status: DC

## 2017-10-03 MED ORDER — FOLIC ACID 1 MG PO TABS: 1.0000 mg | ORAL_TABLET | Freq: Every day | ORAL | 0 refills | Status: DC

## 2017-10-03 MED ORDER — ALBUTEROL SULFATE HFA 108 (90 BASE) MCG/ACT IN AERS: 2.0000 | INHALATION_SPRAY | Freq: Four times a day (QID) | RESPIRATORY_TRACT | 0 refills | Status: DC | PRN

## 2017-10-03 MED ORDER — PREDNISONE 10 MG (21) PO TBPK: ORAL_TABLET | ORAL | Status: DC

## 2017-10-03 MED ORDER — ACETAMINOPHEN 325 MG PO TABS: 650.0000 mg | ORAL_TABLET | Freq: Four times a day (QID) | ORAL | Status: AC | PRN

## 2017-10-03 MED ORDER — METHOCARBAMOL 500 MG PO TABS: 500.0000 mg | ORAL_TABLET | Freq: Three times a day (TID) | ORAL | 0 refills | Status: DC | PRN

## 2017-10-03 MED ORDER — THIAMINE HCL 100 MG PO TABS: 100.0000 mg | ORAL_TABLET | Freq: Every day | ORAL | 0 refills | Status: DC

## 2017-10-03 MED ORDER — AMOXICILLIN-POT CLAVULANATE 875-125 MG PO TABS: 1.0000 | ORAL_TABLET | Freq: Two times a day (BID) | ORAL | 0 refills | Status: DC

## 2017-10-03 MED ORDER — HYDROCORTISONE 1 % EX CREA
TOPICAL_CREAM | CUTANEOUS | Status: DC | PRN
Start: 2017-10-03 — End: 2017-10-03
  Administered 2017-10-03: 10:00:00 via TOPICAL
  Filled 2017-10-03: qty 28

## 2017-10-03 MED ORDER — AMOXICILLIN-POT CLAVULANATE 875-125 MG PO TABS: 1.0000 | ORAL_TABLET | Freq: Two times a day (BID) | ORAL | 0 refills | Status: AC

## 2017-10-03 MED ORDER — ADULT MULTIVITAMIN W/MINERALS CH: 1.0000 | ORAL_TABLET | Freq: Every day | ORAL | Status: DC

## 2017-10-03 MED ORDER — NICOTINE 21 MG/24HR TD PT24: 21.0000 mg | MEDICATED_PATCH | Freq: Every day | TRANSDERMAL | 0 refills | Status: DC

## 2017-10-03 MED ORDER — IPRATROPIUM-ALBUTEROL 0.5-2.5 (3) MG/3ML IN SOLN: 3.0000 mL | Freq: Two times a day (BID) | RESPIRATORY_TRACT | Status: DC

## 2017-10-03 NOTE — Discharge Summary (Addendum)
[facility]   PATIENT NAME: Alexander Lindsey    MR#:  [id number]  DATE OF BIRTH:  Jul 14, 1971  DATE OF ADMISSION:  09/29/2017 ADMITTING PHYSICIAN: Auburn Bilberry, MD  DATE OF DISCHARGE:  10/03/17  PRIMARY CARE PHYSICIAN: Patient, No Pcp Per    ADMISSION DIAGNOSIS:  Acute respiratory failure with hypoxia (HCC) [J96.01] Worsening pneumonia [J18.9]  DISCHARGE DIAGNOSIS:  Active Problems:   Acute respiratory failure (HCC)  Pneumonia with pleural effusion status post thoracentesis SECONDARY DIAGNOSIS:   Past Medical History:  Diagnosis Date  . Alcohol abuse   . Hypertension   . Tobacco use     HOSPITAL COURSE:  HISTORY OF PRESENT ILLNESS: Alexander Lindsey  is a 47 y.o. male with a known history of alcohol abuse, essential hypertension and nicotine abuse who was recently hospitalized on Wednesday with acute respiratory failure requiring BiPAP noted to have pneumonia.  He was discharged home yesterday.  He was ambulated and did not need oxygen.  When he got home he was feeling better however around 11 PM he started having worsening shortness of breath breathing continue to get worse therefore he comes to the emergency room.  In the ER he was noted to have a right-sided worsening infiltrate and effusion.  He had to be placed back on BiPAP.  Patient reports that he was not drinking any alcohol at home.  He did smoke cigarette.  He complains of productive cough of yellow sputum.  No fevers or chills  1.Acute respiratory failure due to worsening pneumonia possible para pneumonic effusion -Status post right-sided thoracentesis and tapped app 500 cc    -ultrasound-guided thoracentesis  by interventional radiology scheduled for next day but could not perform thoracentesis as patient did not have enough fluid.  off BiPAP Improved with vancomycin and Zosyn.  Discharge patient with the p.o. Augmentin Appreciate pulmonology follow-up on steroids  and nebulizer therapy due to bronchospasm Incentive spirometry  2.Essential hypertension blood pressure currently stable use as needed medications as needed  3.History of alcohol abuse this would be day 5 without evidence of any withdrawal Continue to monitor I will hold off onCIWA protocol  4.  Back pain will give muscle relaxants Improved PT evaluation-no PT needs identified   Nicotine abuse:Smoking cessation provided 4 minutes spent strongly recommend he stop smoking nicotine patch will be started      DISCHARGE CONDITIONS:   stable  CONSULTS OBTAINED:     PROCEDURES thoracentesis  DRUG ALLERGIES:  No Known Allergies  DISCHARGE MEDICATIONS:   Allergies as of 10/03/2017   No Known Allergies     Medication List    STOP taking these medications   azithromycin 250 MG tablet Commonly known as:  ZITHROMAX Z-PAK   chlorhexidine 0.12 % solution Commonly known as:  PERIDEX   lidocaine 2 % solution Commonly known as:  XYLOCAINE     TAKE these medications   acetaminophen 325 MG tablet Commonly known as:  TYLENOL Take 2 tablets (650 mg total) by mouth every 6 (six) hours as needed for mild pain (or Fever >/= 101).   albuterol 108 (90 Base) MCG/ACT inhaler Commonly known as:  PROVENTIL HFA;VENTOLIN HFA Inhale 2 puffs into the lungs every 6 (six) hours as needed for wheezing or shortness of breath.   amoxicillin-clavulanate 875-125 MG tablet Commonly known as:  AUGMENTIN Take 1 tablet by mouth 2 (two) times daily for 14 days.   folic acid 1 MG tablet Commonly known as:  [employer]  Take 1 tablet (1 mg total) by mouth daily. Start taking on:  10/04/2017   guaiFENesin 600 MG 12 hr tablet Commonly known as:  MUCINEX Take 1 tablet (600 mg total) by mouth 2 (two) times daily.   LORazepam 0.5 MG tablet Commonly known as:  ATIVAN Take 1 tablet (0.5 mg total) by mouth every 8 (eight) hours.   methocarbamol 500 MG tablet Commonly known as:  ROBAXIN Take  1 tablet (500 mg total) by mouth every 8 (eight) hours as needed for muscle spasms.   multivitamin with minerals Tabs tablet Take 1 tablet by mouth daily. Start taking on:  10/04/2017   nicotine 21 mg/24hr patch Commonly known as:  NICODERM CQ - dosed in mg/24 hours Place 1 patch (21 mg total) onto the skin daily. Start taking on:  10/04/2017   predniSONE 10 MG (21) Tbpk tablet Commonly known as:  STERAPRED UNI-PAK 21 TAB Taper by 10 mg po daily   thiamine 100 MG tablet Take 1 tablet (100 mg total) by mouth daily. Start taking on:  10/04/2017        DISCHARGE INSTRUCTIONS:   Follow-up with primary care physician in 5-7 days Follow-up with pulmonology as needed Outpatient follow-up with alcohol anonymous  DIET:  Low salt  DISCHARGE CONDITION:  Fair  ACTIVITY:  Activity as tolerated  OXYGEN:  Home Oxygen: No.   Oxygen Delivery: room air  DISCHARGE LOCATION:  home   If you experience worsening of your admission symptoms, develop shortness of breath, life threatening emergency, suicidal or homicidal thoughts you must seek medical attention immediately by calling 911 or calling your MD immediately  if symptoms less severe.  You Must read complete instructions/literature along with all the possible adverse reactions/side effects for all the Medicines you take and that have been prescribed to you. Take any new Medicines after you have completely understood and accpet all the possible adverse reactions/side effects.   Please note  You were cared for by a hospitalist during your hospital stay. If you have any questions about your discharge medications or the care you received while you were in the hospital after you are discharged, you can call the unit and asked to speak with the hospitalist on call if the hospitalist that took care of you is not available. Once you are discharged, your primary care physician will handle any further medical issues. Please note that NO REFILLS  for any discharge medications will be authorized once you are discharged, as it is imperative that you return to your primary care physician (or establish a relationship with a primary care physician if you do not have one) for your aftercare needs so that they can reassess your need for medications and monitor your lab values.     Today  Chief Complaint  Patient presents with  . Shortness of Breath   Patient is feeling much better.  Denies any shortness of breath.  No chest pain.  Back pain improved.  Wants to go home.  ROS:  CONSTITUTIONAL: Denies fevers, chills. Denies any fatigue, weakness.  EYES: Denies blurry vision, double vision, eye pain. EARS, NOSE, THROAT: Denies tinnitus, ear pain, hearing loss. RESPIRATORY: Denies cough, wheeze, shortness of breath.  CARDIOVASCULAR: Denies chest pain, palpitations, edema.  GASTROINTESTINAL: Denies nausea, vomiting, diarrhea, abdominal pain. Denies bright red blood per rectum. GENITOURINARY: Denies dysuria, hematuria. ENDOCRINE: Denies nocturia or thyroid problems. HEMATOLOGIC AND LYMPHATIC: Denies easy bruising or bleeding. SKIN: Denies rash or lesion. MUSCULOSKELETAL: Denies pain in neck, back, shoulder,  knees, hips or arthritic symptoms.  NEUROLOGIC: Denies paralysis, paresthesias.  PSYCHIATRIC: Denies anxiety or depressive symptoms.   VITAL SIGNS:  Blood pressure (!) 138/91, pulse 75, temperature 97.9 F (36.6 C), temperature source Oral, resp. rate 18, height 6\' 3"  (1.905 m), weight 86.8 kg (191 lb 5.8 oz), SpO2 96 %.  I/O:    Intake/Output Summary (Last 24 hours) at 10/03/2017 1225 Last data filed at 10/03/2017 1100 Gross per 24 hour  Intake 1170 ml  Output 300 ml  Net 870 ml    PHYSICAL EXAMINATION:  GENERAL:  47 y.o.-year-old patient lying in the bed with no acute distress.  EYES: Pupils equal, round, reactive to light and accommodation. No scleral icterus. Extraocular muscles intact.  HEENT: Head atraumatic,  normocephalic. Oropharynx and nasopharynx clear.  NECK:  Supple, no jugular venous distention. No thyroid enlargement, no tenderness.  LUNGS: Normal breath sounds bilaterally, no wheezing, rales,rhonchi or crepitation. No use of accessory muscles of respiration.  CARDIOVASCULAR: S1, S2 normal. No murmurs, rubs, or gallops.  ABDOMEN: Soft, non-tender, non-distended. Bowel sounds present. No organomegaly or mass.  EXTREMITIES: No pedal edema, cyanosis, or clubbing.  NEUROLOGIC: Cranial nerves II through XII are intact. Muscle strength 5/5 in all extremities. Sensation intact. Gait not checked.  PSYCHIATRIC: The patient is alert and oriented x 3.  SKIN: No obvious rash, lesion, or ulcer.   DATA REVIEW:   CBC Recent Labs  Lab 10/01/17 0405  WBC 18.2*  HGB 12.2*  HCT 37.6*  PLT 370    Chemistries  Recent Labs  Lab 09/29/17 1216 09/30/17 0417 10/01/17 0405 10/02/17 1449  NA 135 136 137  --   K 4.2 4.5 4.5  --   CL 101 104 105  --   CO2 23 23 23   --   GLUCOSE 110* 141* 139*  --   BUN 15 14 21*  --   CREATININE 0.87 0.85 0.85 0.92  CALCIUM 9.2 8.9 8.6*  --   MG  --  2.3  --   --   AST 37  --   --   --   ALT 38  --   --   --   ALKPHOS 63  --   --   --   BILITOT 0.6  --   --   --     Cardiac Enzymes Recent Labs  Lab 09/29/17 1216  TROPONINI 0.03*    Microbiology Results  Results for orders placed or performed during the hospital encounter of 09/29/17  Blood culture (routine x 2)     Status: None (Preliminary result)   Collection Time: 09/29/17 12:16 PM  Result Value Ref Range Status   Specimen Description BLOOD RIGHT ANTECUBITAL  Final   Special Requests   Final    BOTTLES DRAWN AEROBIC AND ANAEROBIC Blood Culture adequate volume   Culture   Final    NO GROWTH 4 DAYS Performed at [facility], [address] [place], [place] [address]    Report Status PENDING  Incomplete  Blood culture (routine x 2)     Status: None (Preliminary result)   Collection  Time: 09/29/17 12:50 PM  Result Value Ref Range Status   Specimen Description BLOOD LEFT ANTECUBITAL  Final   Special Requests   Final    BOTTLES DRAWN AEROBIC AND ANAEROBIC Blood Culture adequate volume   Culture   Final    NO GROWTH 4 DAYS Performed at [facility], [address] [place], [place] [address]  Report Status PENDING  Incomplete  Body fluid culture     Status: None   Collection Time: 09/29/17  5:17 PM  Result Value Ref Range Status   Specimen Description   Final    PLEURAL Performed at [facility], [address], [place], [place] [address]    Special Requests   Final    NONE Performed at [facility], [address] [place], [place] [address]    Gram Stain   Final    ABUNDANT WBC PRESENT,BOTH PMN AND MONONUCLEAR NO ORGANISMS SEEN    Culture   Final    NO GROWTH 3 DAYS Performed at [facility], [address] [place], [place] [address]    Report Status 10/03/2017 FINAL  Final    RADIOLOGY:  Dg Chest 2 View  Result Date: 09/29/2017 CLINICAL DATA:  Acute shortness of breath. EXAM: CHEST  2 VIEW COMPARISON:  09/26/2017 radiographs and CT FINDINGS: Increasing moderate to large right pleural effusion noted. Associated right lower lung atelectasis/airspace disease again noted. The visualized cardiomediastinal silhouette is unchanged. There is no evidence of left pleural effusion. No pneumothorax identified. IMPRESSION: Increasing moderate to large right pleural effusion. Associated right lower lung atelectasis versus airspace disease noted. Electronically Signed   By: Harmon Pier M.D.   On: 09/29/2017 12:48   Ct Chest W Contrast  Result Date: 09/29/2017 CLINICAL DATA:  Shortness of breath and cough EXAM: CT CHEST WITH CONTRAST TECHNIQUE: Multidetector CT imaging of the chest was performed during intravenous contrast administration. CONTRAST:  75mL ISOVUE-300 IOPAMIDOL (ISOVUE-300) INJECTION 61% COMPARISON:  Chest CT September 26, 2017; chest radiographs September 29, 2017 FINDINGS: Cardiovascular: There is no appreciable thoracic aortic aneurysm or dissection. The visualized great vessels appear unremarkable. Note that the right and left common carotid arteries arise as a common trunk, an anatomic variant. There is no appreciable pericardial effusion or pericardial thickening. There is no major vessel pulmonary embolus demonstrable. Mediastinum/Nodes: Thyroid appears unremarkable. There is a lymph node anterior to the carina measuring 1.4 x 1.2 cm. There is a subcarinal lymph node measuring 1.8 x 1.6 cm. These lymph nodes are slightly larger compared to recent study. No esophageal lesions are evident. Lungs/Pleura: There is a moderate partially loculated and partially free-flowing pleural effusion on the right, slightly larger than recent study. There is consolidation throughout most of the right lower lobe. There is also patchy consolidation in a portion of the right middle lobe with increased volume loss in the right middle lobe compared to recent study. There is new patchy airspace consolidation in the left lower lobe with atelectatic change and possibly early pneumonia in the inferior lingula. There is a minimal left pleural effusion. There is a new focus of patchy infiltrate in the medial aspect of the anterior segment of the left upper lobe. There is atelectatic change in the right upper lobe, increased from recent CT. Upper Abdomen: Visualized upper abdominal structures appear normal. Musculoskeletal: There are no evident blastic or lytic bone lesions. IMPRESSION: 1. Multifocal pneumonia with consolidation greatest in the right middle and lower lobes with new infiltrate in the left base and anterior segment left upper lobe medially. Areas of atelectatic change have also increased. 2. Moderate pleural effusion on the right, partially loculated and partially free-flowing. Small left pleural effusion. 3. Prominent lymph nodes in the  anterior peri carinal and subcarinal regions, slightly larger than on recent CT. Suspect reactive etiology given the changes in the lung parenchyma. Electronically Signed  By: Bretta Bang III M.D.   On: 09/29/2017 14:34   [place] Chest (pleural Effusion)  Result Date: 10/01/2017 CLINICAL DATA:  Right pleural effusion, assess for thoracentesis EXAM: CHEST ULTRASOUND COMPARISON:  09/29/2017 chest x-ray FINDINGS: Small right effusion with dense left lower lobe collapse/consolidation. There is not enough pleural fluid to warrant therapeutic thoracentesis. Procedure not performed. IMPRESSION: Small right pleural effusion.  See above comment. Electronically Signed   By: Judie Petit.  Shick M.D.   On: 10/01/2017 09:25   Dg Chest Port 1 View  Result Date: 09/29/2017 CLINICAL DATA:  Post RIGHT thoracentesis EXAM: PORTABLE CHEST 1 VIEW COMPARISON:  Portable exam 1707 hours compared to early CT and radiographic exams of 09/29/2017 FINDINGS: Enlargement of cardiac silhouette. Mediastinal contours and pulmonary vascularity normal. Persistent moderate-sized RIGHT pleural effusion and basilar atelectasis. No definite pneumothorax. Minimal LEFT basilar atelectasis remains. Upper lungs clear. External artifacts project over chest. IMPRESSION: Persistent RIGHT pleural effusion and basilar atelectasis. No pneumothorax. Electronically Signed   By: Ulyses Southward M.D.   On: 09/29/2017 17:24    EKG:   Orders placed or performed during the hospital encounter of 09/29/17  . ED EKG  . ED EKG  . EKG 12-Lead  . EKG 12-Lead      Management plans discussed with the patient, family and they are in agreement.  CODE STATUS:     Code Status Orders  (From admission, onward)        Start     Ordered   09/29/17 1536  Full code  Continuous     09/29/17 1535    Code Status History    Date Active Date Inactive Code Status Order ID Comments User Context   09/26/2017 17:57 09/28/2017 18:39 Full Code [id number]  Milagros Loll, MD ED       TOTAL TIME TAKING CARE OF THIS PATIENT: 42  minutes.   Note: This dictation was prepared with Dragon dictation along with smaller phrase technology. Any transcriptional errors that result from this process are unintentional.   @[facility] @  on 10/03/2017 at 12:25 PM  Between 7am to 6pm - Pager - [phone]  After 6pm go to [email or web address] - password [email or web address]  [facility]  Office  [phone]  CC: Primary care physician; Patient, No Pcp Per

## 2017-10-03 NOTE — Discharge Instructions (Signed)
Follow-up with primary care physician in 5-7 days Follow-up with pulmonology as needed Outpatient follow-up with alcohol anonymous

## 2017-10-04 ENCOUNTER — Other Ambulatory Visit: Payer: Self-pay

## 2017-10-04 ENCOUNTER — Emergency Department
Admission: EM | Admit: 2017-10-04 | Discharge: 2017-10-04 | Disposition: A | Discharge status: Home / Self Care | Payer: Medicaid Other | Attending: Emergency Medicine | Admitting: Emergency Medicine

## 2017-10-04 DIAGNOSIS — J181 Lobar pneumonia, unspecified organism: Secondary | ICD-10-CM

## 2017-10-04 DIAGNOSIS — Z79899 Other long term (current) drug therapy: Secondary | ICD-10-CM | POA: Diagnosis not present

## 2017-10-04 DIAGNOSIS — R21 Rash and other nonspecific skin eruption: Secondary | ICD-10-CM | POA: Diagnosis present

## 2017-10-04 DIAGNOSIS — J189 Pneumonia, unspecified organism: Secondary | ICD-10-CM | POA: Diagnosis not present

## 2017-10-04 DIAGNOSIS — I10 Essential (primary) hypertension: Secondary | ICD-10-CM | POA: Insufficient documentation

## 2017-10-04 DIAGNOSIS — T360X5A Adverse effect of penicillins, initial encounter: Secondary | ICD-10-CM | POA: Insufficient documentation

## 2017-10-04 DIAGNOSIS — L500 Allergic urticaria: Secondary | ICD-10-CM | POA: Diagnosis not present

## 2017-10-04 DIAGNOSIS — F17210 Nicotine dependence, cigarettes, uncomplicated: Secondary | ICD-10-CM | POA: Insufficient documentation

## 2017-10-04 DIAGNOSIS — Z881 Allergy status to other antibiotic agents status: Secondary | ICD-10-CM

## 2017-10-04 LAB — CULTURE, BLOOD (ROUTINE X 2)
CULTURE: NO GROWTH
CULTURE: NO GROWTH
SPECIAL REQUESTS: ADEQUATE
SPECIAL REQUESTS: ADEQUATE

## 2017-10-04 MED ORDER — LEVOFLOXACIN 750 MG PO TABS: 750.0000 mg | ORAL_TABLET | Freq: Every day | ORAL | 0 refills | Status: AC

## 2017-10-04 MED ORDER — LEVOFLOXACIN 750 MG PO TABS
750.0000 mg | ORAL_TABLET | Freq: Once | ORAL | Status: AC
  Administered 2017-10-04: 750 mg via ORAL
  Filled 2017-10-04: qty 1

## 2017-10-04 MED ORDER — LEVOFLOXACIN 750 MG PO TABS: 750.0000 mg | ORAL_TABLET | Freq: Every day | ORAL | 0 refills | Status: DC

## 2017-10-04 NOTE — ED Notes (Signed)
Pt was discharged from hospital yesterday for pneumonia. Diffuse rash/hives all over his body likely medication reaction.

## 2017-10-04 NOTE — Discharge Instructions (Addendum)
You appear to be having an allergic reaction to one of your recent medications. The likely cause is your antibiotic. You can start the new antibiotic and continue to dose the other medicines. Continue to monitor your symptoms. Return to the ED as needed.

## 2017-10-04 NOTE — ED Triage Notes (Addendum)
Pt arrives to ED via POV from home with c/o a rash from a possible medication reaction. Pt reports recently starting 4 new medications for the flu. Pt reports being d/c'd from the hospital yesterday following admission for pneumonia. Pt reports rash that started last night on arms, back, legs, and genital area. Pt presents with 4 prescription medications, including Amox-Clav 875-125, Methocarbamol, Prednisone, and Folic Acid. Pt denies any c/o throat swelling, difficulty managing secretions, or breathing distress. Pt is A&O, in NAD; RR even, regular, and unlabored.

## 2017-10-04 NOTE — ED Provider Notes (Signed)
[facility] Emergency Department Provider Note ____________________________________________  Time seen: 2028  I have reviewed the triage vital signs and the nursing notes.  HISTORY  Chief Complaint  Medication Reaction  HPI Alexander Lindsey is a 47 y.o. male presents himself to the ED from home, for evaluation of a rash and possible medication reaction.  Patient was discharged yesterday after a 2 night stay for community-acquired pneumonia and acute respiratory distress.  He was discharged with prescriptions for Augmentin, methocarbamol, prednisone, and folic acid.  He describes taking the medications as prescribed this morning.  He noted the onset of large whelps to his arms, chest, and trunk.  He discontinued the all of the medications with the exception of the steroid at the onset of his rash.  He denies any previous history of known allergy or sensitivity to amoxicillin/penicillin.  During the interview however, he notes that he experienced some mild irritation and itching while still an inpatient.  He describes it was after he was given a muscle relaxant.  He can't at this time, however, confirmed that it was that medication specifically as he was on IV medications and p.o. medications simultaneously.  He presents today with some mild right lower rib chest pain but notes no intermittent fevers, congestion, or swelling around the mouth, lips, or tongue.  He has not taken any antihistamines for his symptoms.  Past Medical History:  Diagnosis Date  . Alcohol abuse   . Hypertension   . Tobacco use     Patient Active Problem List   Diagnosis Date Noted  . Acute respiratory failure (HCC) 09/29/2017  . Pneumonia 09/26/2017    History reviewed. No pertinent surgical history.  Prior to Admission medications   Medication Sig Start Date End Date Taking? Authorizing Provider  acetaminophen (TYLENOL) 325 MG tablet Take 2 tablets (650 mg total) by mouth every 6 (six)  hours as needed for mild pain (or Fever >/= 101). 10/03/17   Gouru, Deanna ArtisAruna, MD  albuterol (PROVENTIL HFA;VENTOLIN HFA) 108 (90 Base) MCG/ACT inhaler Inhale 2 puffs into the lungs every 6 (six) hours as needed for wheezing or shortness of breath. 10/03/17   Ramonita LabGouru, Aruna, MD  amoxicillin-clavulanate (AUGMENTIN) 875-125 MG tablet Take 1 tablet by mouth 2 (two) times daily for 14 days. 10/03/17 10/17/17  Ramonita LabGouru, Aruna, MD  folic acid (FOLVITE) 1 MG tablet Take 1 tablet (1 mg total) by mouth daily. 10/04/17   Ramonita LabGouru, Aruna, MD  guaiFENesin (MUCINEX) 600 MG 12 hr tablet Take 1 tablet (600 mg total) by mouth 2 (two) times daily. 10/03/17   Ramonita LabGouru, Aruna, MD  levofloxacin (LEVAQUIN) 750 MG tablet Take 1 tablet (750 mg total) by mouth daily for 6 days. 10/04/17 10/10/17  Alexsandria Kivett, Charlesetta IvoryJenise V Bacon, PA-C  LORazepam (ATIVAN) 0.5 MG tablet Take 1 tablet (0.5 mg total) by mouth every 8 (eight) hours. 09/28/17   Katha HammingKonidena, Snehalatha, MD  methocarbamol (ROBAXIN) 500 MG tablet Take 1 tablet (500 mg total) by mouth every 8 (eight) hours as needed for muscle spasms. 10/03/17   Ramonita LabGouru, Aruna, MD  Multiple Vitamin (MULTIVITAMIN WITH MINERALS) TABS tablet Take 1 tablet by mouth daily. 10/04/17   Gouru, Deanna ArtisAruna, MD  nicotine (NICODERM CQ - DOSED IN MG/24 HOURS) 21 mg/24hr patch Place 1 patch (21 mg total) onto the skin daily. 10/04/17   Gouru, Deanna ArtisAruna, MD  predniSONE (STERAPRED UNI-PAK 21 TAB) 10 MG (21) TBPK tablet Taper by 10 mg po daily 10/03/17   Ramonita LabGouru, Aruna, MD  thiamine 100 MG  tablet Take 1 tablet (100 mg total) by mouth daily. 10/04/17   Ramonita Lab, MD    Allergies Patient has no known allergies.  Family History  Problem Relation Age of Onset  . CAD Father     Social History Social History   Tobacco Use  . Smoking status: Current Every Day Smoker    Packs/day: 0.50    Types: Cigarettes  . Smokeless tobacco: Never Used  Substance Use Topics  . Alcohol use: Yes    Alcohol/week: 12.6 oz    Types: 21 Cans of beer per week  .  Drug use: No    Review of Systems  Constitutional: Negative for fever. Eyes: Negative for visual changes. ENT: Negative for sore throat. Cardiovascular: Negative for chest pain. Respiratory: Negative for shortness of breath. Gastrointestinal: Negative for abdominal pain, vomiting and diarrhea. Genitourinary: Negative for dysuria. Musculoskeletal: Negative for back pain. Reports right chest wall pain as persistent. Skin: Positive for rash. Neurological: Negative for headaches, focal weakness or numbness. ____________________________________________  PHYSICAL EXAM:  VITAL SIGNS: ED Triage Vitals  Enc Vitals Group     BP 10/04/17 1935 122/71     Pulse Rate 10/04/17 1935 96     Resp 10/04/17 1935 16     Temp 10/04/17 1935 98.5 F (36.9 C)     Temp Source 10/04/17 1935 Oral     SpO2 10/04/17 1935 97 %     Weight 10/04/17 1932 187 lb (84.8 kg)     Height 10/04/17 1932 6\' 3"  (1.905 m)     Head Circumference --      Peak Flow --      Pain Score 10/04/17 1932 4     Pain Loc --      Pain Edu? --      Excl. in GC? --     Constitutional: Alert and oriented. Well appearing and in no distress. Head: Normocephalic and atraumatic. Eyes: Conjunctivae are normal. PERRL. Normal extraocular movements Mouth/Throat: Mucous membranes are moist.  Uvula is midline. Cardiovascular: Normal rate, regular rhythm. Normal distal pulses. Respiratory: Normal respiratory effort. No wheezes/rales/rhonchi. Gastrointestinal: Soft and nontender. No distention. Musculoskeletal: Nontender with normal range of motion in all extremities.  Neurologic:  Normal gait without ataxia. Normal speech and language. No gross focal neurologic deficits are appreciated. Skin:  Skin is warm, dry and intact. Patient with resolving hives to the UE and trunk. ____________________________________________  PROCEDURES  Procedures Levaquin 750 mg PO ____________________________________________  INITIAL IMPRESSION /  ASSESSMENT AND PLAN / ED COURSE  Patient with ED evaluation of a presumed sensitivity to Augmentin.  Patient presents today 1 day after discharge and beginning his oral Augmentin for his community-acquired pneumonia.  He presents now with resolving hives to the trunk and extremities.  He has had no angioedema, shortness of breath, or dyspnea.  He is at this time asked to discontinue the Augmentin and will be started on a 7-day course of Levaquin.  He will restart the other medications as prescribed and continue to monitor for any symptoms.  Return precautions have been reviewed.  He will otherwise see his provider as planned. ____________________________________________  FINAL CLINICAL IMPRESSION(S) / ED DIAGNOSES  Final diagnoses:  Allergic reaction to Augmentin  Community acquired pneumonia of right lower lobe of lung [facility])      Karmen Stabs, Charlesetta Ivory, PA-C 10/04/17 2359    Pershing Proud Myra Rude, MD 10/05/17 1524

## 2017-11-13 ENCOUNTER — Telehealth: Payer: Self-pay | Admitting: Licensed Clinical Social Worker

## 2017-11-13 NOTE — Telephone Encounter (Signed)
Clinician reached out to patient due to being referred by [facility] to become a new patient at [facility]. She explained the eligibility requirements and documents needed to become a new patient. She stated the hours of operation.   Patient stated he would like to become a patient and can bring in the documents hopefully in the next week.

## 2018-04-23 ENCOUNTER — Emergency Department: Payer: Self-pay

## 2018-04-23 ENCOUNTER — Encounter: Payer: Self-pay | Admitting: Emergency Medicine

## 2018-04-23 ENCOUNTER — Emergency Department
Admission: EM | Admit: 2018-04-23 | Discharge: 2018-04-23 | Disposition: A | Discharge status: Home / Self Care | Payer: Self-pay | Attending: Emergency Medicine | Admitting: Emergency Medicine

## 2018-04-23 DIAGNOSIS — Z79899 Other long term (current) drug therapy: Secondary | ICD-10-CM | POA: Insufficient documentation

## 2018-04-23 DIAGNOSIS — Z720 Tobacco use: Secondary | ICD-10-CM

## 2018-04-23 DIAGNOSIS — J449 Chronic obstructive pulmonary disease, unspecified: Secondary | ICD-10-CM | POA: Insufficient documentation

## 2018-04-23 DIAGNOSIS — I10 Essential (primary) hypertension: Secondary | ICD-10-CM | POA: Insufficient documentation

## 2018-04-23 DIAGNOSIS — J40 Bronchitis, not specified as acute or chronic: Secondary | ICD-10-CM | POA: Insufficient documentation

## 2018-04-23 DIAGNOSIS — F17210 Nicotine dependence, cigarettes, uncomplicated: Secondary | ICD-10-CM | POA: Insufficient documentation

## 2018-04-23 DIAGNOSIS — Z881 Allergy status to other antibiotic agents status: Secondary | ICD-10-CM | POA: Insufficient documentation

## 2018-04-23 MED ORDER — ALBUTEROL SULFATE HFA 108 (90 BASE) MCG/ACT IN AERS: 2.0000 | INHALATION_SPRAY | Freq: Four times a day (QID) | RESPIRATORY_TRACT | 0 refills | Status: AC | PRN

## 2018-04-23 MED ORDER — PREDNISONE 20 MG PO TABS: 60.0000 mg | ORAL_TABLET | Freq: Every day | ORAL | 0 refills | Status: DC

## 2018-04-23 MED ORDER — AZITHROMYCIN 250 MG PO TABS: ORAL_TABLET | ORAL | 0 refills | Status: AC

## 2018-04-23 MED ORDER — ALBUTEROL SULFATE (2.5 MG/3ML) 0.083% IN NEBU
5.0000 mg | INHALATION_SOLUTION | Freq: Once | RESPIRATORY_TRACT | Status: AC
  Administered 2018-04-23: 5 mg via RESPIRATORY_TRACT
  Filled 2018-04-23: qty 6

## 2018-04-23 MED ORDER — PREDNISONE 20 MG PO TABS
60.0000 mg | ORAL_TABLET | Freq: Once | ORAL | Status: AC
  Administered 2018-04-23: 60 mg via ORAL
  Filled 2018-04-23: qty 3

## 2018-04-23 NOTE — ED Provider Notes (Signed)
[facility] Emergency Department Provider Note  ____________________________________________   I have reviewed the triage vital signs and the nursing notes. Where available I have reviewed prior notes and, if possible and indicated, outside hospital notes.    HISTORY  Chief Complaint Shortness of Breath    HPI Alexander Lindsey is a 47 y.o. male with a history of COPD it appears as well as pneumonia in the past, allergic to Augmentin from prior administration for similar, states he had a runny nose, and sore throat and cough and low-grade fever for the last couple days.  He states his congestion is finally cleared his sore throat is better but he still has a residual cough.  It is productive.  Denies recurrent fever.  States that he has had no nausea no vomiting no chest pain, he denies any lower extremity edema, he denies any orthopnea, he has no paroxysmal nocturnal dyspnea, he states that he has no personal or family history of PE or DVT.  Patient does continue to smoke a pack of cigarettes a day.  He did receive an inhaler here from triage, and states he is feeling better.   Past Medical History:  Diagnosis Date  . Alcohol abuse   . Hypertension   . Tobacco use     Patient Active Problem List   Diagnosis Date Noted  . Acute respiratory failure (HCC) 09/29/2017  . Pneumonia 09/26/2017    No past surgical history on file.  Prior to Admission medications   Medication Sig Start Date End Date Taking? Authorizing Provider  acetaminophen (TYLENOL) 325 MG tablet Take 2 tablets (650 mg total) by mouth every 6 (six) hours as needed for mild pain (or Fever >/= 101). 10/03/17   Gouru, Deanna Artis, MD  albuterol (PROVENTIL HFA;VENTOLIN HFA) 108 (90 Base) MCG/ACT inhaler Inhale 2 puffs into the lungs every 6 (six) hours as needed for wheezing or shortness of breath. 10/03/17   Ramonita Lab, MD  folic acid (FOLVITE) 1 MG tablet Take 1 tablet (1 mg total) by mouth daily.  10/04/17   Ramonita Lab, MD  guaiFENesin (MUCINEX) 600 MG 12 hr tablet Take 1 tablet (600 mg total) by mouth 2 (two) times daily. 10/03/17   Gouru, Deanna Artis, MD  LORazepam (ATIVAN) 0.5 MG tablet Take 1 tablet (0.5 mg total) by mouth every 8 (eight) hours. 09/28/17   Katha Hamming, MD  methocarbamol (ROBAXIN) 500 MG tablet Take 1 tablet (500 mg total) by mouth every 8 (eight) hours as needed for muscle spasms. 10/03/17   Ramonita Lab, MD  Multiple Vitamin (MULTIVITAMIN WITH MINERALS) TABS tablet Take 1 tablet by mouth daily. 10/04/17   Gouru, Deanna Artis, MD  nicotine (NICODERM CQ - DOSED IN MG/24 HOURS) 21 mg/24hr patch Place 1 patch (21 mg total) onto the skin daily. 10/04/17   Gouru, Deanna Artis, MD  predniSONE (STERAPRED UNI-PAK 21 TAB) 10 MG (21) TBPK tablet Taper by 10 mg po daily 10/03/17   Gouru, Deanna Artis, MD  thiamine 100 MG tablet Take 1 tablet (100 mg total) by mouth daily. 10/04/17   Ramonita Lab, MD    Allergies Augmentin [amoxicillin-pot clavulanate]  Family History  Problem Relation Age of Onset  . CAD Father     Social History Social History   Tobacco Use  . Smoking status: Current Every Day Smoker    Packs/day: 1.00    Types: Cigarettes  . Smokeless tobacco: Never Used  Substance Use Topics  . Alcohol use: Yes    Alcohol/week: 21.0 standard  drinks    Types: 21 Cans of beer per week  . Drug use: No    Review of Systems Constitutional: No more fever, did have a low-grade Eyes: No visual changes. ENT: Improved sore throat. No stiff neck no neck pain Cardiovascular: Denies chest pain. Respiratory: See HPI regarding shortness of breath. Gastrointestinal:   no vomiting.  No diarrhea.  No constipation. Genitourinary: Negative for dysuria. Musculoskeletal: Negative lower extremity swelling Skin: Negative for rash. Neurological: Negative for severe headaches, focal weakness or numbness.   ____________________________________________   PHYSICAL EXAM:  VITAL SIGNS: ED Triage  Vitals  Enc Vitals Group     BP 04/23/18 1429 (!) 141/103     Pulse Rate 04/23/18 1429 95     Resp 04/23/18 1429 18     Temp 04/23/18 1429 98.5 F (36.9 C)     Temp Source 04/23/18 1429 Oral     SpO2 04/23/18 1429 97 %     Weight 04/23/18 1431 185 lb (83.9 kg)     Height 04/23/18 1431 6\' 3"  (1.905 m)     Head Circumference --      Peak Flow --      Pain Score 04/23/18 1430 0     Pain Loc --      Pain Edu? --      Excl. in GC? --     Constitutional: Alert and oriented. Well appearing and in no acute distress. Eyes: Conjunctivae are normal Head: Atraumatic HEENT: No congestion/rhinnorhea. Mucous membranes are moist.  Oropharynx non-erythematous Neck:   Nontender with no meningismus, no masses, no stridor Cardiovascular: Normal rate, regular rhythm. Grossly normal heart sounds.  Good peripheral circulation. Respiratory: Normal respiratory effort.  No retractions.  Somewhat coarse in the bases no rales no rhonchi, normal respiratory effort speaks in full sentences, Abdominal: Soft and nontender. No distention. No guarding no rebound Back:  There is no focal tenderness or step off.  there is no midline tenderness there are no lesions noted. there is no CVA tenderness Musculoskeletal: No lower extremity tenderness, no upper extremity tenderness. No joint effusions, no DVT signs strong distal pulses no edema Neurologic:  Normal speech and language. No gross focal neurologic deficits are appreciated.  Skin:  Skin is warm, dry and intact. No rash noted. Psychiatric: Mood and affect are normal. Speech and behavior are normal.  ____________________________________________   LABS (all labs ordered are listed, but only abnormal results are displayed)  Labs Reviewed - No data to display  Pertinent labs  results that were available during my care of the patient were reviewed by me and considered in my medical decision making (see chart for  details). ____________________________________________  EKG  I personally interpreted any EKGs ordered by me or triage nsr rate 92 no ste no std + repoll abnormality + rae ____________________________________________  RADIOLOGY  Pertinent labs & imaging results that were available during my care of the patient were reviewed by me and considered in my medical decision making (see chart for details). If possible, patient and/or family made aware of any abnormal findings.  Dg Chest 2 View  Result Date: 04/23/2018 CLINICAL DATA:  Shortness of breath EXAM: CHEST - 2 VIEW COMPARISON:  September 29, 2017 FINDINGS: No edema or consolidation. The heart size and pulmonary vascularity are normal. No adenopathy. No pneumothorax. No bone lesions. IMPRESSION: No edema or consolidation. Electronically Signed   By: Bretta Bang III M.D.   On: 04/23/2018 14:46   ____________________________________________    PROCEDURES  Procedure(s) performed: None  Procedures  Critical Care performed: None  ____________________________________________   INITIAL IMPRESSION / ASSESSMENT AND PLAN / ED COURSE  Pertinent labs & imaging results that were available during my care of the patient were reviewed by me and considered in my medical decision making (see chart for details).  Patient here with cough and wheeze, at this time is not actually wheezing he feels better his lungs are not completely clear but he is moving good air.  Patient was recently quite ill with this, he was admitted to the hospital x2 and a short period of time with a significant pneumonia and pleural effusion.  Therefore, I think is quite reasonable that the patient came in.  Fortunately, at this time I do not see any evidence of anything significant in his chest x-ray or his exam.  Certainly nothing to suggest ACS PE dissection myocarditis enteritis pericarditis pneumonia or pneumothorax.  However, patient does have a history of  decompensating significantly and he is still smoking a pack of cigarettes a day.  I did spend 5 minutes counseling him about tobacco abuse.  Given his history of ongoing tobacco abuse, and significant recent pneumonia, I will start him on antibiotics as a precaution as he does have the potential to get much more ill although at this time he looks well.  Also give him steroids since he does have any wheezes amenable to albuterol, will send him home with albuterol treatment.  We have referred him to primary care doctor and return precautions and follow-up given and understood.  Sats are 98 to 99% when I am in the room    ____________________________________________   FINAL CLINICAL IMPRESSION(S) / ED DIAGNOSES  Final diagnoses:  None      This chart was dictated using voice recognition software.  Despite best efforts to proofread,  errors can occur which can change meaning.      Jeanmarie Plant, MD 04/23/18 1535

## 2018-04-23 NOTE — ED Triage Notes (Signed)
Patient presents to the ED with complaint of shortness of breath with exertion since Thursday.  Patient reports that he drinks a 12 pack per day and smokes a pack of cigarettes per day and has family history of COPD.

## 2018-04-23 NOTE — Discharge Instructions (Addendum)
Medications as prescribed, if you have increased difficulty breathing, shortness of breath chest pain or you feel worse in any way return to the emergency department.

## 2018-05-06 ENCOUNTER — Other Ambulatory Visit: Payer: Self-pay

## 2018-05-06 ENCOUNTER — Emergency Department
Admission: EM | Admit: 2018-05-06 | Discharge: 2018-05-06 | Disposition: A | Discharge status: Home / Self Care | Payer: Self-pay | Attending: Emergency Medicine | Admitting: Emergency Medicine

## 2018-05-06 ENCOUNTER — Emergency Department: Payer: Self-pay

## 2018-05-06 ENCOUNTER — Encounter: Payer: Self-pay | Admitting: Emergency Medicine

## 2018-05-06 DIAGNOSIS — Y999 Unspecified external cause status: Secondary | ICD-10-CM | POA: Insufficient documentation

## 2018-05-06 DIAGNOSIS — Y939 Activity, unspecified: Secondary | ICD-10-CM | POA: Insufficient documentation

## 2018-05-06 DIAGNOSIS — R51 Headache: Secondary | ICD-10-CM | POA: Insufficient documentation

## 2018-05-06 DIAGNOSIS — S01511A Laceration without foreign body of lip, initial encounter: Secondary | ICD-10-CM | POA: Insufficient documentation

## 2018-05-06 DIAGNOSIS — S0083XA Contusion of other part of head, initial encounter: Secondary | ICD-10-CM

## 2018-05-06 DIAGNOSIS — Z79899 Other long term (current) drug therapy: Secondary | ICD-10-CM | POA: Insufficient documentation

## 2018-05-06 DIAGNOSIS — S0181XA Laceration without foreign body of other part of head, initial encounter: Secondary | ICD-10-CM

## 2018-05-06 DIAGNOSIS — F17210 Nicotine dependence, cigarettes, uncomplicated: Secondary | ICD-10-CM | POA: Insufficient documentation

## 2018-05-06 DIAGNOSIS — Y929 Unspecified place or not applicable: Secondary | ICD-10-CM | POA: Insufficient documentation

## 2018-05-06 DIAGNOSIS — I10 Essential (primary) hypertension: Secondary | ICD-10-CM | POA: Insufficient documentation

## 2018-05-06 MED ORDER — LIDOCAINE-EPINEPHRINE 2 %-1:100000 IJ SOLN
INTRAMUSCULAR | Status: AC
  Filled 2018-05-06: qty 1

## 2018-05-06 NOTE — ED Notes (Signed)
Reviewed discharge instructions and follow-up care with patient. Patient verbalized understanding of all information reviewed. Patient stable, with no distress noted at this time.    
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
## 2018-05-06 NOTE — Discharge Instructions (Addendum)
1.  Suture removal in 5 days. 2.  Apply ice to affected area several times daily. 3.  Return to the ER for worsening symptoms, persistent vomiting, lethargy or other concerns.

## 2018-05-06 NOTE — ED Notes (Signed)
Ice applied to patient's left face. Warm blanket provided for patient comfort.

## 2018-05-11 ENCOUNTER — Other Ambulatory Visit: Payer: Self-pay

## 2018-05-11 ENCOUNTER — Encounter: Payer: Self-pay | Admitting: Emergency Medicine

## 2018-05-11 ENCOUNTER — Emergency Department
Admission: EM | Admit: 2018-05-11 | Discharge: 2018-05-11 | Disposition: A | Discharge status: Home / Self Care | Payer: Self-pay | Attending: Emergency Medicine | Admitting: Emergency Medicine

## 2018-05-11 DIAGNOSIS — Z79899 Other long term (current) drug therapy: Secondary | ICD-10-CM | POA: Insufficient documentation

## 2018-05-11 DIAGNOSIS — Z4802 Encounter for removal of sutures: Secondary | ICD-10-CM | POA: Insufficient documentation

## 2018-05-11 DIAGNOSIS — X58XXXD Exposure to other specified factors, subsequent encounter: Secondary | ICD-10-CM | POA: Insufficient documentation

## 2018-05-11 DIAGNOSIS — I10 Essential (primary) hypertension: Secondary | ICD-10-CM | POA: Insufficient documentation

## 2018-05-11 DIAGNOSIS — S01511D Laceration without foreign body of lip, subsequent encounter: Secondary | ICD-10-CM | POA: Insufficient documentation

## 2018-05-11 DIAGNOSIS — F17210 Nicotine dependence, cigarettes, uncomplicated: Secondary | ICD-10-CM | POA: Insufficient documentation

## 2018-05-11 MED ORDER — SULFAMETHOXAZOLE-TRIMETHOPRIM 800-160 MG PO TABS: 1.0000 | ORAL_TABLET | Freq: Two times a day (BID) | ORAL | 0 refills | Status: AC

## 2018-05-11 NOTE — ED Triage Notes (Signed)
Here for suture removal

## 2018-05-11 NOTE — ED Notes (Signed)
Removed 5 sutures.

## 2018-05-11 NOTE — ED Provider Notes (Signed)
[facility] Emergency Department Provider Note   ____________________________________________   First MD Initiated Contact with Patient 05/11/18 1045     (approximate)  I have reviewed the triage vital signs and the nursing notes.   HISTORY  Chief Complaint Suture / Staple Removal   HPI Alexander Lindsey is a 47 y.o. male patient presents for suture removal secondary to facial laceration 5 days ago.  Area is loosely approximated with greenish-yellow drainage.  Patient denies pain.  No loss of sensation.  Sutures are buried into the patient's facial hair. Past Medical History:  Diagnosis Date  . Alcohol abuse   . Hypertension   . Tobacco use     Patient Active Problem List   Diagnosis Date Noted  . Acute respiratory failure (HCC) 09/29/2017  . Pneumonia 09/26/2017    History reviewed. No pertinent surgical history.  Prior to Admission medications   Medication Sig Start Date End Date Taking? Authorizing Provider  acetaminophen (TYLENOL) 325 MG tablet Take 2 tablets (650 mg total) by mouth every 6 (six) hours as needed for mild pain (or Fever >/= 101). 10/03/17   Gouru, Deanna Artis, MD  albuterol (PROVENTIL HFA;VENTOLIN HFA) 108 (90 Base) MCG/ACT inhaler Inhale 2 puffs into the lungs every 6 (six) hours as needed for wheezing or shortness of breath. 04/23/18   Jeanmarie Plant, MD  sulfamethoxazole-trimethoprim (BACTRIM DS,SEPTRA DS) 800-160 MG tablet Take 1 tablet by mouth 2 (two) times daily. 05/11/18   Joni Reining, PA-C    Allergies Augmentin [amoxicillin-pot clavulanate]  Family History  Problem Relation Age of Onset  . CAD Father     Social History Social History   Tobacco Use  . Smoking status: Current Every Day Smoker    Packs/day: 1.00    Types: Cigarettes  . Smokeless tobacco: Never Used  Substance Use Topics  . Alcohol use: Yes    Alcohol/week: 21.0 standard drinks    Types: 21 Cans of beer per week  . Drug use: No    Review  of Systems Constitutional: No fever/chills Eyes: No visual changes. ENT: No sore throat. Cardiovascular: Denies chest pain. Respiratory: Denies shortness of breath. Gastrointestinal: No abdominal pain.  No nausea, no vomiting.  No diarrhea.  No constipation. Genitourinary: Negative for dysuria. Musculoskeletal: Negative for back pain. Skin: Negative for rash.  Facial laceration Neurological: Negative for headaches, focal weakness or numbness. Psychiatric:EtOH abuse Endocrine:Hypertension Allergic/Immunilogical: Augmentin ____________________________________________   PHYSICAL EXAM:  VITAL SIGNS: ED Triage Vitals  Enc Vitals Group     BP 05/11/18 1035 (!) 147/91     Pulse Rate 05/11/18 1035 93     Resp 05/11/18 1035 18     Temp 05/11/18 1035 98.3 F (36.8 C)     Temp Source 05/11/18 1035 Oral     SpO2 05/11/18 1035 99 %     Weight 05/11/18 1036 184 lb 15.5 oz (83.9 kg)     Height 05/11/18 1036 6\' 3"  (1.905 m)     Head Circumference --      Peak Flow --      Pain Score 05/11/18 1036 0     Pain Loc --      Pain Edu? --      Excl. in GC? --     Constitutional: Alert and oriented. Well appearing and in no acute distress. Cardiovascular: Normal rate, regular rhythm. Grossly normal heart sounds.  Good peripheral circulation. Respiratory: Normal respiratory effort.  No retractions. Lungs CTAB. Skin: Loose reapproximation upper  lip laceration.  Area has mild edema and erythema.   Psychiatric: Mood and affect are normal. Speech and behavior are normal.  ____________________________________________   LABS (all labs ordered are listed, but only abnormal results are displayed)  Labs Reviewed - No data to display ____________________________________________  EKG   ____________________________________________  RADIOLOGY  ED MD interpretation:    Official radiology report(s): No results  found.  ____________________________________________   PROCEDURES  Procedure(s) performed: None  Procedures  Critical Care performed: No  ____________________________________________   INITIAL IMPRESSION / ASSESSMENT AND PLAN / ED COURSE  As part of my medical decision making, I reviewed the following data within the electronic MEDICAL RECORD NUMBER    Patient presents with request for suture removal to left upper lip secondary to laceration 5 days ago.  Area shows signs of infection.  5 interrupted sutures were removed.  Patient given discharge care instruction.  Patient given prescription for Bactrim DS.      ____________________________________________   FINAL CLINICAL IMPRESSION(S) / ED DIAGNOSES  Final diagnoses:  Encounter for removal of sutures     ED Discharge Orders         Ordered    sulfamethoxazole-trimethoprim (BACTRIM DS,SEPTRA DS) 800-160 MG tablet  2 times daily     05/11/18 1112           Note:  This document was prepared using Dragon voice recognition software and may include unintentional dictation errors.    Joni ReiningSmith, Ronald K, PA-C 05/11/18 1114    Sharyn CreamerQuale, Mark, MD 05/11/18 [phone]

## 2018-12-04 IMAGING — US US ABDOMEN LIMITED
1 series · 14 of 25 positions shown · non-contrast
Comparison: None.

CLINICAL DATA: Epigastric pain radiating to the back for the past 3
days.

EXAM:
ULTRASOUND ABDOMEN LIMITED RIGHT UPPER QUADRANT

[Series 1: us abdomen limited · 0.20mm/px · 14 of 39 slices shown]
[im 1/39]
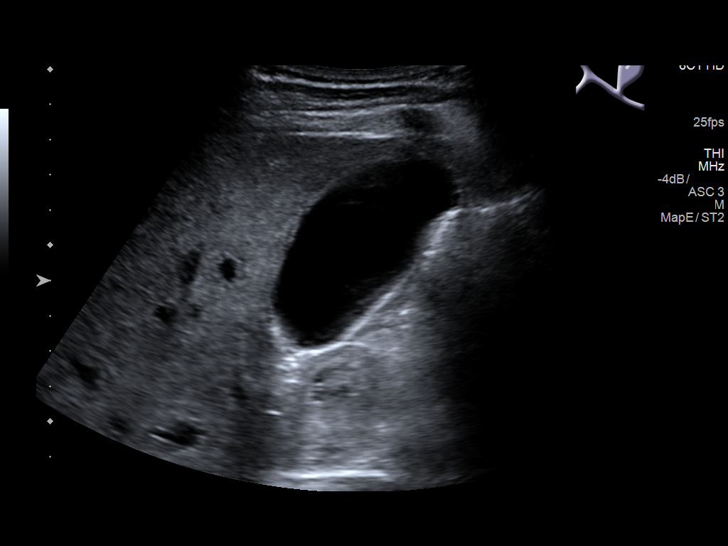
[im 4/39]
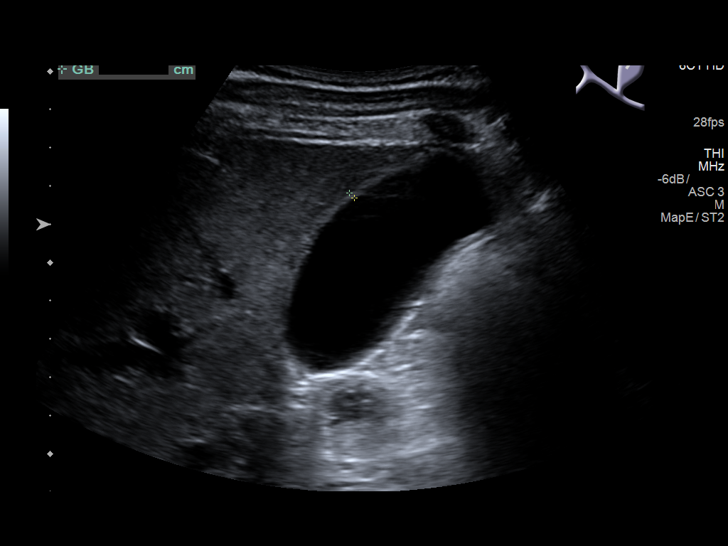
[im 7/39]
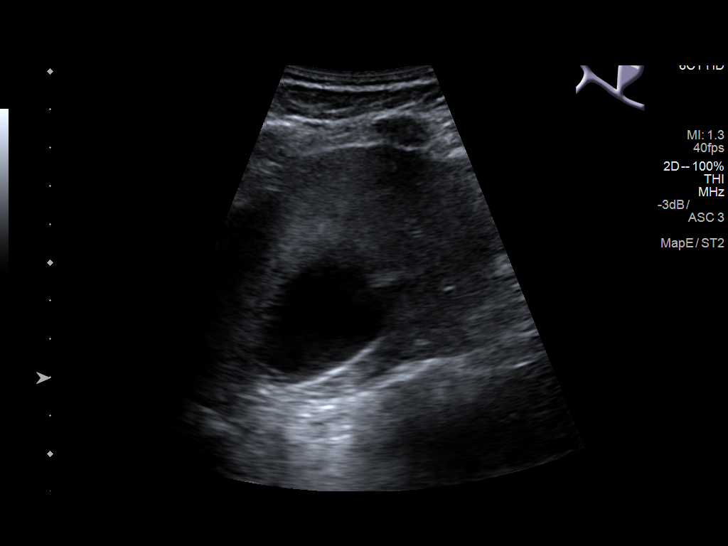
[im 10/39]
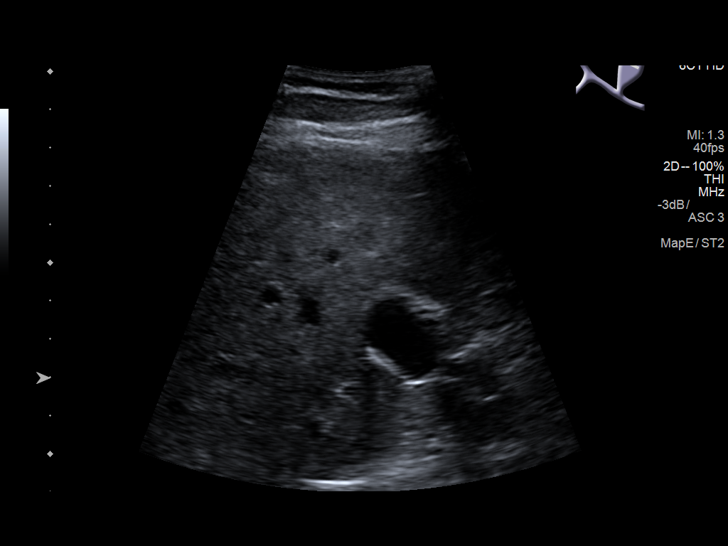
[im 13/39]
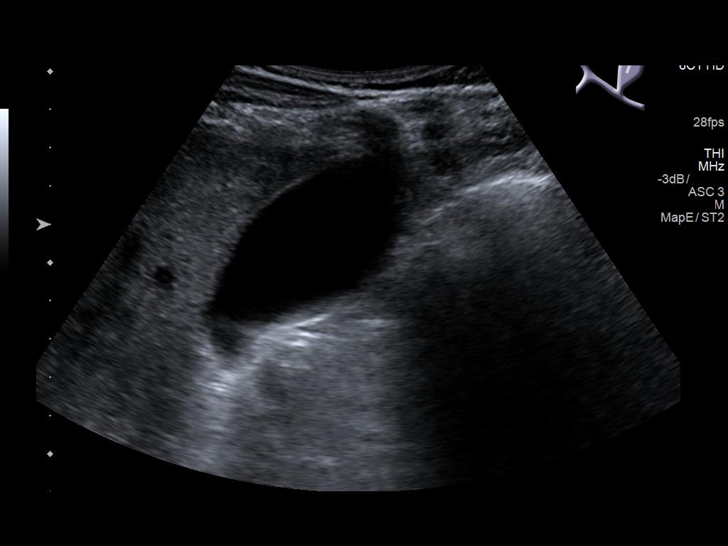
[im 15/39]
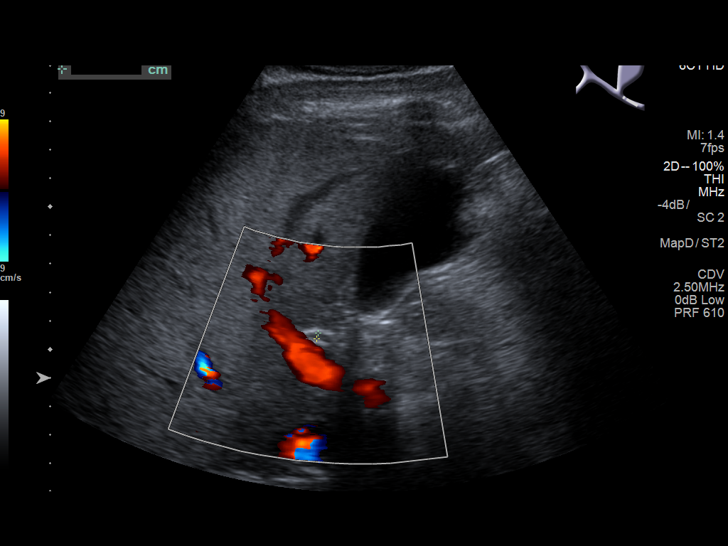
[im 18/39]
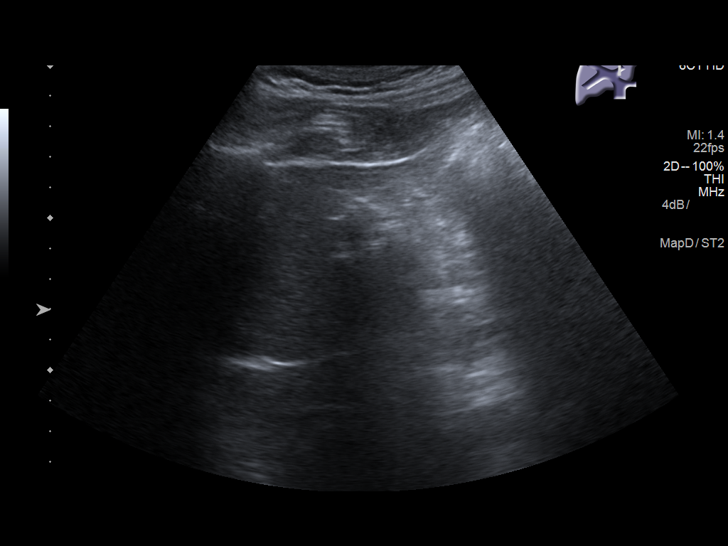
[im 21/39]
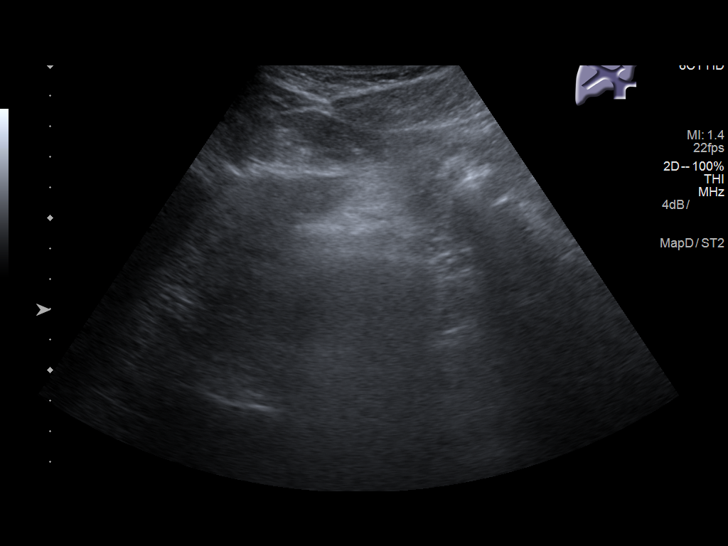
[im 24/39]
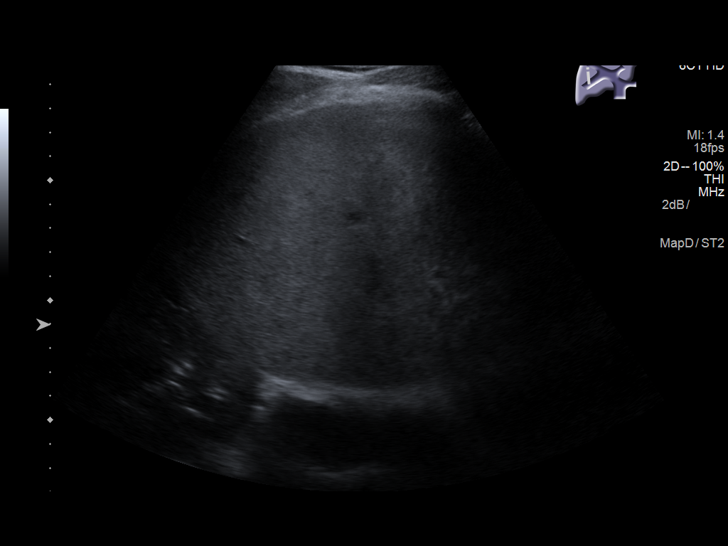
[im 26/39]
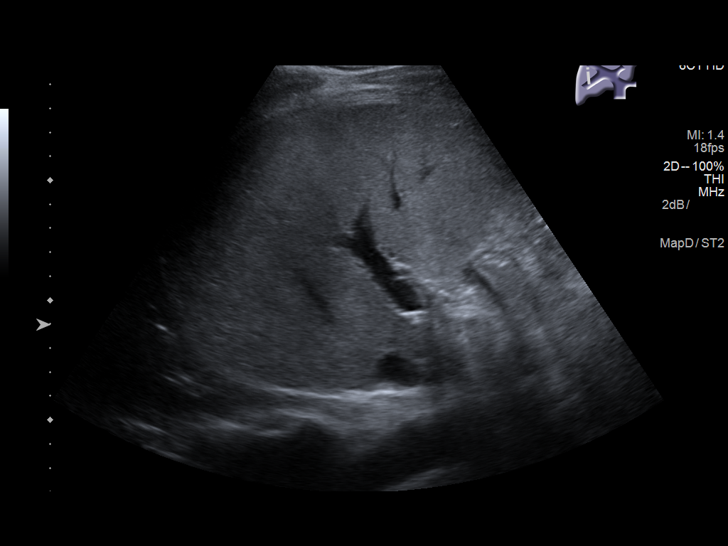
[im 29/39]
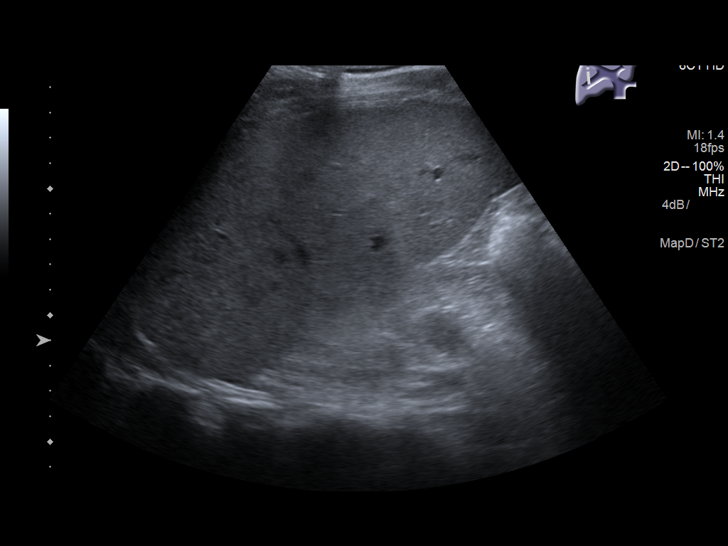
[im 32/39]
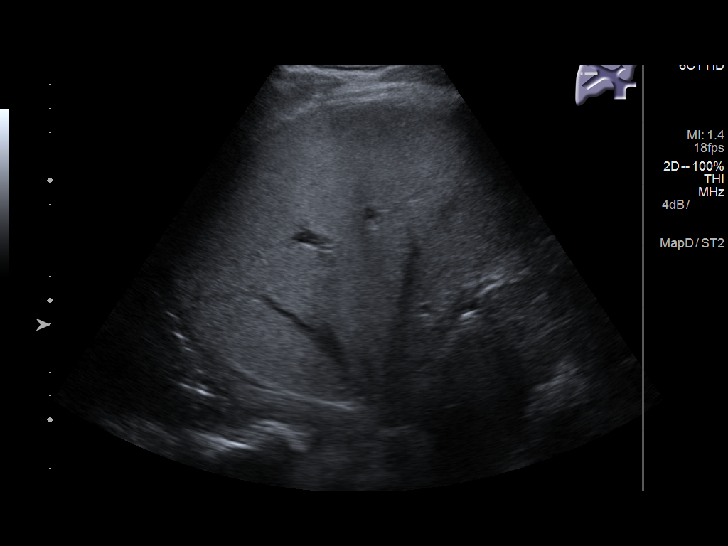
[im 35/39]
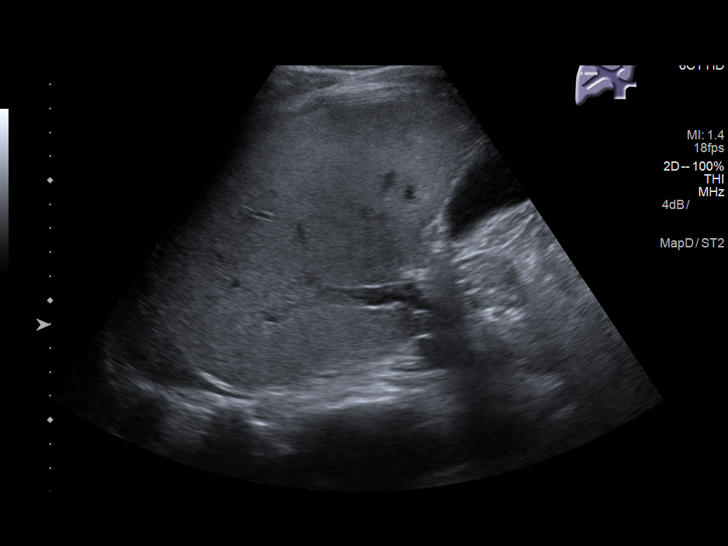
[im 39/39]
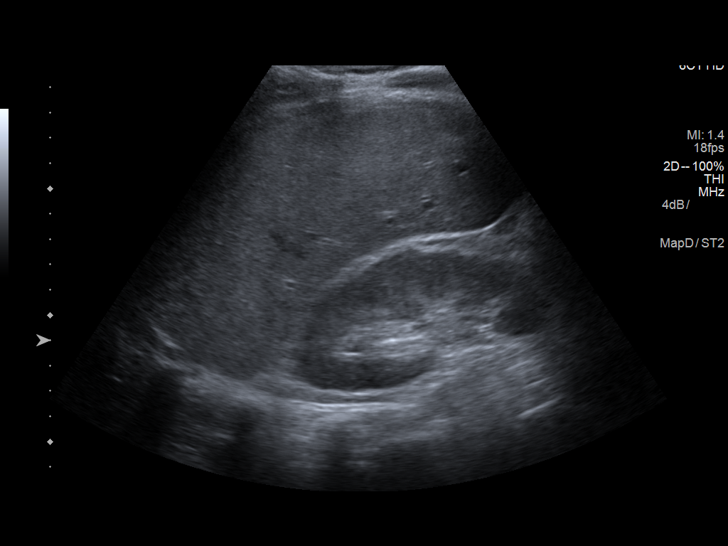

[14 of 25 positions shown; findings below may reference images not displayed]

FINDINGS: Gallbladder:

No gallstones or wall thickening visualized. No sonographic Murphy
sign noted by sonographer.

Common bile duct:

Diameter: 2 mm, normal.

Liver:

No focal lesion identified. Increased in parenchymal echogenicity.
Portal vein is patent on color Doppler imaging with normal direction
of blood flow towards the liver.
IMPRESSION: 1. No acute abnormality.
2. Hepatic steatosis.

## 2019-03-11 IMAGING — DX DG CHEST 1V PORT
1 series · 1 of 1 positions shown · non-contrast
Comparison: Portable exam 1585 hours compared to early CT and
radiographic exams of 09/29/2017

CLINICAL DATA: Post RIGHT thoracentesis

EXAM:
PORTABLE CHEST 1 VIEW

[chest ap]
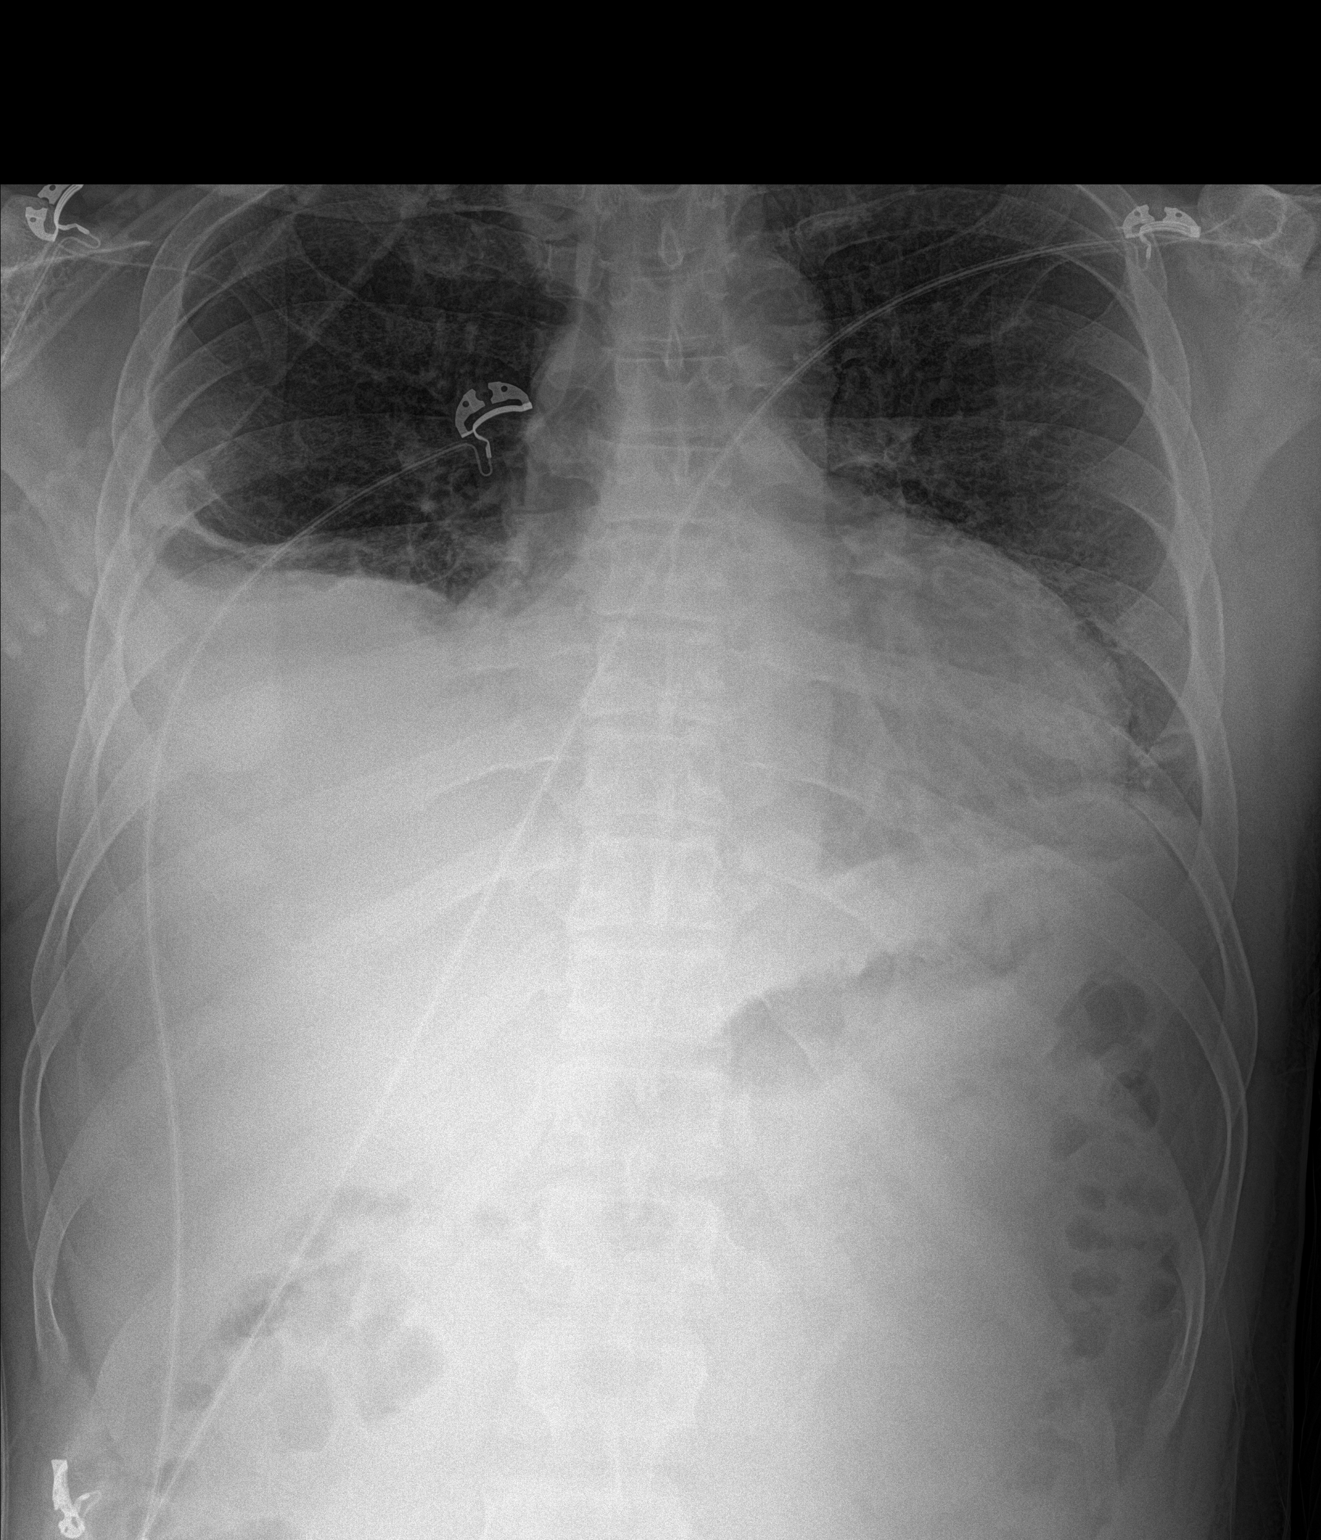

[1 of 1 positions shown; findings below may reference images not displayed]

FINDINGS: Enlargement of cardiac silhouette.

Mediastinal contours and pulmonary vascularity normal.

Persistent moderate-sized RIGHT pleural effusion and basilar
atelectasis.

No definite pneumothorax.

Minimal LEFT basilar atelectasis remains.

Upper lungs clear.

External artifacts project over chest.
IMPRESSION: Persistent RIGHT pleural effusion and basilar atelectasis.

No pneumothorax.

## 2019-03-22 DEATH — deceased
